# Patient Record
Sex: Female | Born: 1980 | Race: White | Hispanic: No | Smoking: Never smoker
Health system: Southern US, Community
[De-identification: ages and names within clinical notes are randomized; demographics above are authoritative.]

## PROBLEM LIST (undated history)

## (undated) DIAGNOSIS — O99345 Other mental disorders complicating the puerperium: Secondary | ICD-10-CM

## (undated) DIAGNOSIS — G4730 Sleep apnea, unspecified: Secondary | ICD-10-CM

## (undated) DIAGNOSIS — S5290XA Unspecified fracture of unspecified forearm, initial encounter for closed fracture: Secondary | ICD-10-CM

## (undated) DIAGNOSIS — S52209A Unspecified fracture of shaft of unspecified ulna, initial encounter for closed fracture: Secondary | ICD-10-CM

## (undated) DIAGNOSIS — S42409B Unspecified fracture of lower end of unspecified humerus, initial encounter for open fracture: Secondary | ICD-10-CM

## (undated) DIAGNOSIS — F53 Postpartum depression: Secondary | ICD-10-CM

## (undated) DIAGNOSIS — D649 Anemia, unspecified: Secondary | ICD-10-CM

## (undated) HISTORY — PX: INTRAUTERINE DEVICE INSERTION: SHX323

---

## 2005-08-17 ENCOUNTER — Emergency Department: Payer: Self-pay | Admitting: Emergency Medicine

## 2005-10-14 ENCOUNTER — Emergency Department: Payer: Self-pay | Admitting: Unknown Physician Specialty

## 2005-10-16 ENCOUNTER — Emergency Department: Payer: Self-pay | Admitting: Emergency Medicine

## 2006-03-23 ENCOUNTER — Emergency Department: Payer: Self-pay | Admitting: Emergency Medicine

## 2006-11-02 ENCOUNTER — Emergency Department: Payer: Self-pay | Admitting: Unknown Physician Specialty

## 2006-11-05 ENCOUNTER — Emergency Department: Payer: Self-pay

## 2009-07-26 ENCOUNTER — Emergency Department: Payer: Self-pay | Admitting: Emergency Medicine

## 2010-08-25 ENCOUNTER — Emergency Department: Payer: Self-pay | Admitting: Emergency Medicine

## 2010-10-09 ENCOUNTER — Emergency Department: Payer: Self-pay | Admitting: Unknown Physician Specialty

## 2010-10-30 ENCOUNTER — Ambulatory Visit: Payer: Medicaid Other | Admitting: Family Medicine

## 2010-10-30 ENCOUNTER — Encounter: Payer: Self-pay | Admitting: Family Medicine

## 2010-10-30 DIAGNOSIS — J069 Acute upper respiratory infection, unspecified: Secondary | ICD-10-CM

## 2010-10-30 DIAGNOSIS — J309 Allergic rhinitis, unspecified: Secondary | ICD-10-CM | POA: Insufficient documentation

## 2010-10-30 DIAGNOSIS — Z331 Pregnant state, incidental: Secondary | ICD-10-CM

## 2010-11-01 ENCOUNTER — Emergency Department: Payer: Self-pay | Admitting: Emergency Medicine

## 2010-11-04 ENCOUNTER — Encounter: Payer: Self-pay | Admitting: Family Medicine

## 2010-11-06 NOTE — Assessment & Plan Note (Signed)
Summary: SORE THROAT, ACHY BODY/EVM   Vital Signs:  Patient Profile:   30 Years Old Female CC:      sore throat, sick Weight:      144 pounds O2 Sat:      100 % O2 treatment:    Room Air Temp:     100.2 degrees F oral Pulse rate:   79 / minute Pulse rhythm:   regular Resp:     16 per minute  Pt. in pain?   no  Vitals Entered By: Standley Dakins MD (October 30, 2010 2:27 PM)                   Current Allergies (reviewed today): No known allergies History of Present Illness History from: patient Reason for visit: see chief complaint Chief Complaint: sore throat, sick History of Present Illness: The patient is currently [redacted] weeks pregnant and reporting that she is having sore throat x 2 days, nausea and started vomiting this morning.  She has had emesis 5 times today.  She has been taking OTC cough/cold meds.  She has taken Tylenol Multisymptom formulation.  She is having no diarrhea.  She reports that people at her job have been sick with URI symptoms.  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  She says she has some weakness and body aches.  She is having allergy symptoms of sneezing, RN, postnasal drainage.  Coughing makes her vomit.  She can keep fluids down but not solids.    REVIEW OF SYSTEMS Constitutional Symptoms       Complains of weight gain and fatigue.     Denies fever, chills, night sweats, and weight loss.  Eyes       Denies change in vision, eye pain, eye discharge, glasses, contact lenses, and eye surgery. Ear/Nose/Throat/Mouth       Complains of frequent nose bleeds, sore throat, and hoarseness.      Denies hearing loss/aids, change in hearing, ear pain, ear discharge, dizziness, frequent runny nose, sinus problems, and tooth pain or bleeding.  Respiratory       Complains of dry cough and shortness of breath.      Denies productive cough, wheezing, asthma, bronchitis, and emphysema/COPD.  Cardiovascular       Denies murmurs, chest pain, and tires easily with  exhertion.    Gastrointestinal       Complains of nausea/vomiting.      Denies stomach pain, diarrhea, constipation, blood in bowel movements, and indigestion. Genitourniary       Denies painful urination, kidney stones, and loss of urinary control. Neurological       Complains of headaches and weakness.      Denies loss of or changes in sensation, numbness, tngling, tremors, paralysis, seizures, and fainting/blackouts. Musculoskeletal       Denies muscle pain, joint pain, joint stiffness, decreased range of motion, redness, swelling, muscle weakness, and gout.  Skin       Denies bruising, unusual mles/lumps or sores, and hair/skin or nail changes.  Psych       Denies mood changes, temper/anger issues, anxiety/stress, speech problems, depression, and sleep problems.  Past History:  Family History: Last updated: 10/30/2010 Healthy per patient  Social History: Last updated: 10/30/2010 Never Smoked Alcohol use-no Drug use-no Occupation:  [employer]  Risk Factors: Smoking Status: never (10/30/2010)  Past Medical History: Allergic Rhinitis Intrauterine pregnancy  Past Surgical History: Denies surgical history  Family History: Healthy per patient  Social  History: Never Smoked Alcohol use-no Drug use-no Occupation:  Archivist Smoking Status:  never Drug Use:  no Physical Exam General appearance: well developed, well nourished, no acute distress Head: normocephalic, atraumatic Eyes: conjunctivae and lids normal Pupils: equal, round, reactive to light Ears: normal, no lesions or deformities Nasal: pale, boggy, swollen nasal turbinates Oral/Pharynx: moist mucous membranes, tongue normal, posterior pharynx without erythema or exudate Neck: neck supple,  trachea midline, no masses Chest/Lungs: no rales, wheezes, or rhonchi bilateral, breath sounds equal without effort Heart: regular rate and  rhythm, no murmur Extremities: normal  extremities Neurological: grossly intact and non-focal Skin: no obvious rashes or lesions MSE: oriented to time, place, and person Assessment New Problems: INTRAUTERINE PREGNANCY (ICD-V22.2) UPPER RESPIRATORY INFECTION, ACUTE (ICD-465.9) ALLERGIC RHINITIS CAUSE UNSPECIFIED (ICD-477.9)   Patient Education: Patient and/or caregiver instructed in the following: rest, fluids, Tylenol prn. The risks, benefits and possible side effects were clearly explained and discussed with the patient.  The patient verbalized clear understanding.  The patient was given instructions to return if symptoms don't improve, worsen or new changes develop.  If it is not during clinic hours and the patient cannot get back to this clinic then the patient was told to seek medical care at an available urgent care or emergency department.  The patient verbalized understanding.   Demonstrates willingness to comply.  Plan New Medications/Changes: ROBITUSSIN DM 100-10 MG/5ML SYRP (DEXTROMETHORPHAN-GUAIFENESIN) take 1 teaspoon by mouth every 6 hours as needed cough and congestion  #5 oz x 0, 10/30/2010, Tyrice Hewitt MD RHINOCORT AQUA 32 MCG/ACT SUSP (BUDESONIDE) 1-2 sprays per nostril once daily  #1 x 0 [BMN], 10/30/2010, Cortlynn Hollinsworth MD EXTRA STRENGTH ACETAMINOPHEN 500 MG CAPS (ACETAMINOPHEN) take 1 by mouth every 4 hours as needed severe sore throat  #20 x 0, 10/30/2010, Sinjin Amero MD CLARITIN 10 MG TABS (LORATADINE) take 1 by mouth daily for allergies  #20 x 0, 10/30/2010, Mycal Conde MD  Follow Up: Follow up in 2-3 days if no improvement, Follow up on an as needed basis, Follow up with Primary Physician Work/School Excuse: Return to work/school in 2 days  The patient and/or caregiver has been counseled thoroughly with regard to medications prescribed including dosage, schedule, interactions, rationale for use, and possible side effects and they verbalize understanding.  Diagnoses and expected course of  recovery discussed and will return if not improved as expected or if the condition worsens. Patient and/or caregiver verbalized understanding.  Prescriptions: ROBITUSSIN DM 100-10 MG/5ML SYRP (DEXTROMETHORPHAN-GUAIFENESIN) take 1 teaspoon by mouth every 6 hours as needed cough and congestion  #5 oz x 0   Entered and Authorized by:   Standley Dakins MD   Signed by:   Standley Dakins MD on 10/30/2010   Method used:   Electronically to        [facility] [phone]* (retail)       [address]       [place], [place]  [address]       Ph: [phone]       Fax: [phone]   RxID:   [id number] RHINOCORT AQUA 32 MCG/ACT SUSP (BUDESONIDE) 1-2 sprays per nostril once daily Brand medically necessary #1 x 0   Entered and Authorized by:   Standley Dakins MD   Signed by:   Standley Dakins MD on 10/30/2010   Method used:   [facility]ronically to        [facility]  [address] [phone]* (retail)       [address]       [place], [place]  [address]       Ph: [phone]       Fax: [phone]   RxID:   [id number] EXTRA STRENGTH ACETAMINOPHEN 500 MG CAPS (ACETAMINOPHEN) take 1 by mouth every 4 hours as needed severe sore throat  #20 x 0   Entered and Authorized by:   Standley Dakins MD   Signed by:   Standley Dakins MD on 10/30/2010   Method used:   Electronically to        [facility] [phone]* (retail)       [address]       [place], [place]  [address]       Ph: [phone]       Fax: [phone]   RxID:   [id number] CLARITIN 10 MG TABS (LORATADINE) take 1 by mouth daily for allergies  #20 x 0   Entered and Authorized by:   Standley Dakins MD   Signed by:   Standley Dakins MD on 10/30/2010   Method used:   Electronically to        [facility] [phone]* (retail)       [address]       [place], [place]  [address]       Ph: [phone]       Fax: [phone]   RxID:   [id number]   Patient Instructions: 1)  Go to the  pharmacy and pick up your prescription (s).  It may take up to 30 mins for electronic prescriptions to be delivered to the pharmacy.  Please call if your pharmacy has not received your prescriptions after 30 minutes.   2)  Take your antibiotic as prescribed until ALL of it is gone, but stop if you develop a rash or swelling and contact our office as soon as possible. 3)  Get plenty of rest, drink lots of clear liquids, and use Tylenol or Ibuprofen for fever and comfort. Return in 7-10 days if you're not better:sooner if you're feeling worse. 4)  Return or go to the ER if no improvement or symptoms getting worse.   5)  See your OB in 1 week for recheck and follow up.  6)  The patient was informed that there is no on-call provider or services available at this clinic during off-hours (when the clinic is closed).  If the patient developed a problem or concern that required immediate attention, the patient was advised to go the the nearest available urgent care or emergency department for medical care.  The patient verbalized understanding.

## 2010-11-06 NOTE — Letter (Signed)
Summary: Out of Work  [facility]  [address]   [place], [place] [address]   Phone: [phone]  Fax: [phone]    October 30, 2010   Employee:  [employer]    To Whom It May Concern:   For Medical reasons, please excuse the above named employee from work for the following dates:  Start:   October 30, 2010    End:   November 01, 2010  If you need additional information, please feel free to contact our office.         Sincerely,    Standley Dakins MD

## 2010-11-12 NOTE — Letter (Signed)
Summary: Faxed authorization form  Faxed authorization form   Imported By: Dorna Leitz 11/06/2010 19:54:29  _____________________________________________________________________  External Attachment:    Type:   Image     Comment:   External Document

## 2011-09-16 HISTORY — PX: BACK SURGERY: SHX140

## 2011-10-15 ENCOUNTER — Emergency Department: Payer: Self-pay | Admitting: Emergency Medicine

## 2011-10-15 LAB — URINALYSIS, COMPLETE
Bilirubin,UR: NEGATIVE
Glucose,UR: NEGATIVE mg/dL (ref 0–75)
Ketone: NEGATIVE
Nitrite: NEGATIVE
Specific Gravity: 1.012 (ref 1.003–1.030)
Squamous Epithelial: 1
WBC UR: 1 /HPF (ref 0–5)

## 2011-10-15 LAB — CBC
HCT: 36 % (ref 35.0–47.0)
HGB: 11.9 g/dL — ABNORMAL LOW (ref 12.0–16.0)
MCV: 88 fL (ref 80–100)
RBC: 4.08 10*6/uL (ref 3.80–5.20)
RDW: 13.5 % (ref 11.5–14.5)
WBC: 4.9 10*3/uL (ref 3.6–11.0)

## 2011-10-15 LAB — COMPREHENSIVE METABOLIC PANEL
Albumin: 4 g/dL (ref 3.4–5.0)
Anion Gap: 11 (ref 7–16)
Calcium, Total: 9.4 mg/dL (ref 8.5–10.1)
Chloride: 103 mmol/L (ref 98–107)
Co2: 27 mmol/L (ref 21–32)
Creatinine: 0.79 mg/dL (ref 0.60–1.30)
EGFR (African American): >60
EGFR (Non-African Amer.): >60
Osmolality: 282 (ref 275–301)
Potassium: 3.5 mmol/L (ref 3.5–5.1)
SGOT(AST): 15 U/L (ref 15–37)

## 2011-10-15 LAB — TROPONIN I: Troponin-I: <0.02 ng/mL

## 2012-01-09 ENCOUNTER — Emergency Department: Payer: Self-pay | Admitting: *Deleted

## 2012-01-09 LAB — DIFFERENTIAL
Basophil %: 0.3 %
Eosinophil #: 0 10*3/uL (ref 0.0–0.7)
Eosinophil %: 0.2 %
Lymphocyte #: 0.4 10*3/uL — ABNORMAL LOW (ref 1.0–3.6)
Monocyte %: 6.5 %

## 2012-01-09 LAB — URINALYSIS, COMPLETE
Blood: NEGATIVE
Nitrite: NEGATIVE
Protein: NEGATIVE
Specific Gravity: 1.013 (ref 1.003–1.030)
WBC UR: 4 /HPF (ref 0–5)

## 2012-01-09 LAB — COMPREHENSIVE METABOLIC PANEL
Albumin: 3.6 g/dL (ref 3.4–5.0)
Alkaline Phosphatase: 65 U/L (ref 50–136)
Anion Gap: 8 (ref 7–16)
BUN: 12 mg/dL (ref 7–18)
Calcium, Total: 7.9 mg/dL — ABNORMAL LOW (ref 8.5–10.1)
Chloride: 103 mmol/L (ref 98–107)
Co2: 26 mmol/L (ref 21–32)
EGFR (African American): >60
EGFR (Non-African Amer.): >60
Osmolality: 273 (ref 275–301)
Potassium: 3.3 mmol/L — ABNORMAL LOW (ref 3.5–5.1)
SGPT (ALT): 16 U/L
Sodium: 137 mmol/L (ref 136–145)

## 2012-01-09 LAB — CBC
HCT: 38.6 % (ref 35.0–47.0)
HGB: 13.2 g/dL (ref 12.0–16.0)
MCH: 29.6 pg (ref 26.0–34.0)
MCHC: 34.3 g/dL (ref 32.0–36.0)
MCV: 87 fL (ref 80–100)
RBC: 4.46 10*6/uL (ref 3.80–5.20)

## 2012-02-16 ENCOUNTER — Ambulatory Visit: Payer: Self-pay | Admitting: Obstetrics and Gynecology

## 2012-04-17 ENCOUNTER — Emergency Department (HOSPITAL_COMMUNITY): Payer: 59

## 2012-04-17 ENCOUNTER — Inpatient Hospital Stay (HOSPITAL_COMMUNITY): Payer: 59 | Admitting: Anesthesiology

## 2012-04-17 ENCOUNTER — Encounter (HOSPITAL_COMMUNITY): Payer: Self-pay | Admitting: *Deleted

## 2012-04-17 ENCOUNTER — Inpatient Hospital Stay (HOSPITAL_COMMUNITY): Payer: 59

## 2012-04-17 ENCOUNTER — Encounter (HOSPITAL_COMMUNITY): Payer: Self-pay | Admitting: Anesthesiology

## 2012-04-17 ENCOUNTER — Encounter (HOSPITAL_COMMUNITY): Admission: EM | Disposition: A | Discharge status: Rehabilitation Facility | Payer: Self-pay | Source: Home / Self Care

## 2012-04-17 ENCOUNTER — Inpatient Hospital Stay (HOSPITAL_COMMUNITY)
Admission: EM | Admit: 2012-04-17 | Discharge: 2012-04-27 | DRG: 958 | Disposition: A | Discharge status: Rehabilitation Facility | Payer: 59 | Attending: General Surgery | Admitting: General Surgery

## 2012-04-17 DIAGNOSIS — S81009A Unspecified open wound, unspecified knee, initial encounter: Secondary | ICD-10-CM | POA: Diagnosis present

## 2012-04-17 DIAGNOSIS — K560 Paralytic ileus: Secondary | ICD-10-CM | POA: Diagnosis present

## 2012-04-17 DIAGNOSIS — S2243XA Multiple fractures of ribs, bilateral, initial encounter for closed fracture: Secondary | ICD-10-CM

## 2012-04-17 DIAGNOSIS — S0285XA Fracture of orbit, unspecified, initial encounter for closed fracture: Secondary | ICD-10-CM

## 2012-04-17 DIAGNOSIS — S42413B Displaced simple supracondylar fracture without intercondylar fracture of unspecified humerus, initial encounter for open fracture: Secondary | ICD-10-CM | POA: Diagnosis present

## 2012-04-17 DIAGNOSIS — S52509A Unspecified fracture of the lower end of unspecified radius, initial encounter for closed fracture: Secondary | ICD-10-CM | POA: Diagnosis present

## 2012-04-17 DIAGNOSIS — T07XXXA Unspecified multiple injuries, initial encounter: Secondary | ICD-10-CM

## 2012-04-17 DIAGNOSIS — S0240DA Maxillary fracture, left side, initial encounter for closed fracture: Secondary | ICD-10-CM | POA: Diagnosis present

## 2012-04-17 DIAGNOSIS — D62 Acute posthemorrhagic anemia: Secondary | ICD-10-CM | POA: Diagnosis present

## 2012-04-17 DIAGNOSIS — S02400A Malar fracture unspecified, initial encounter for closed fracture: Secondary | ICD-10-CM | POA: Diagnosis present

## 2012-04-17 DIAGNOSIS — S71009A Unspecified open wound, unspecified hip, initial encounter: Secondary | ICD-10-CM | POA: Diagnosis present

## 2012-04-17 DIAGNOSIS — S82401B Unspecified fracture of shaft of right fibula, initial encounter for open fracture type I or II: Secondary | ICD-10-CM

## 2012-04-17 DIAGNOSIS — S52209A Unspecified fracture of shaft of unspecified ulna, initial encounter for closed fracture: Secondary | ICD-10-CM

## 2012-04-17 DIAGNOSIS — S42409B Unspecified fracture of lower end of unspecified humerus, initial encounter for open fracture: Secondary | ICD-10-CM | POA: Diagnosis present

## 2012-04-17 DIAGNOSIS — R000 Tachycardia, unspecified: Secondary | ICD-10-CM | POA: Diagnosis present

## 2012-04-17 DIAGNOSIS — IMO0002 Reserved for concepts with insufficient information to code with codable children: Secondary | ICD-10-CM | POA: Diagnosis present

## 2012-04-17 DIAGNOSIS — S060X9A Concussion with loss of consciousness of unspecified duration, initial encounter: Secondary | ICD-10-CM | POA: Diagnosis present

## 2012-04-17 DIAGNOSIS — S32009A Unspecified fracture of unspecified lumbar vertebra, initial encounter for closed fracture: Secondary | ICD-10-CM | POA: Diagnosis present

## 2012-04-17 DIAGNOSIS — R0902 Hypoxemia: Secondary | ICD-10-CM | POA: Diagnosis present

## 2012-04-17 DIAGNOSIS — S0230XA Fracture of orbital floor, unspecified side, initial encounter for closed fracture: Secondary | ICD-10-CM | POA: Diagnosis present

## 2012-04-17 DIAGNOSIS — N390 Urinary tract infection, site not specified: Secondary | ICD-10-CM | POA: Diagnosis not present

## 2012-04-17 DIAGNOSIS — S82409B Unspecified fracture of shaft of unspecified fibula, initial encounter for open fracture type I or II: Secondary | ICD-10-CM

## 2012-04-17 DIAGNOSIS — S71109A Unspecified open wound, unspecified thigh, initial encounter: Secondary | ICD-10-CM | POA: Diagnosis present

## 2012-04-17 DIAGNOSIS — S022XXA Fracture of nasal bones, initial encounter for closed fracture: Principal | ICD-10-CM | POA: Diagnosis present

## 2012-04-17 DIAGNOSIS — S82201B Unspecified fracture of shaft of right tibia, initial encounter for open fracture type I or II: Secondary | ICD-10-CM | POA: Diagnosis present

## 2012-04-17 DIAGNOSIS — S82209B Unspecified fracture of shaft of unspecified tibia, initial encounter for open fracture type I or II: Secondary | ICD-10-CM

## 2012-04-17 DIAGNOSIS — S02401A Maxillary fracture, unspecified, initial encounter for closed fracture: Secondary | ICD-10-CM | POA: Diagnosis present

## 2012-04-17 DIAGNOSIS — S32039A Unspecified fracture of third lumbar vertebra, initial encounter for closed fracture: Secondary | ICD-10-CM | POA: Diagnosis present

## 2012-04-17 DIAGNOSIS — S2249XA Multiple fractures of ribs, unspecified side, initial encounter for closed fracture: Secondary | ICD-10-CM

## 2012-04-17 DIAGNOSIS — S0990XA Unspecified injury of head, initial encounter: Secondary | ICD-10-CM

## 2012-04-17 DIAGNOSIS — S060X0A Concussion without loss of consciousness, initial encounter: Secondary | ICD-10-CM | POA: Diagnosis present

## 2012-04-17 DIAGNOSIS — S71112A Laceration without foreign body, left thigh, initial encounter: Secondary | ICD-10-CM | POA: Diagnosis present

## 2012-04-17 HISTORY — DX: Unspecified fracture of lower end of unspecified humerus, initial encounter for open fracture: S42.409B

## 2012-04-17 HISTORY — DX: Unspecified fracture of shaft of unspecified ulna, initial encounter for closed fracture: S52.209A

## 2012-04-17 HISTORY — DX: Postpartum depression: F53.0

## 2012-04-17 HISTORY — PX: ORIF HUMERUS FRACTURE: SHX2126

## 2012-04-17 HISTORY — DX: Other mental disorders complicating the puerperium: O99.345

## 2012-04-17 HISTORY — PX: TIBIA IM NAIL INSERTION: SHX2516

## 2012-04-17 HISTORY — DX: Unspecified fracture of unspecified forearm, initial encounter for closed fracture: S52.90XA

## 2012-04-17 LAB — COMPREHENSIVE METABOLIC PANEL
AST: 128 U/L — ABNORMAL HIGH (ref 0–37)
Albumin: 3.6 g/dL (ref 3.5–5.2)
BUN: 8 mg/dL (ref 6–23)
Calcium: 8.1 mg/dL — ABNORMAL LOW (ref 8.4–10.5)
Creatinine, Ser: 0.85 mg/dL (ref 0.50–1.10)
Total Protein: 7.3 g/dL (ref 6.0–8.3)

## 2012-04-17 LAB — POCT I-STAT, CHEM 8
BUN: 6 mg/dL (ref 6–23)
Calcium, Ion: 1.09 mmol/L — ABNORMAL LOW (ref 1.12–1.23)
Creatinine, Ser: 1.2 mg/dL — ABNORMAL HIGH (ref 0.50–1.10)
TCO2: 18 mmol/L (ref 0–100)

## 2012-04-17 LAB — RAPID URINE DRUG SCREEN, HOSP PERFORMED
Amphetamines: NOT DETECTED
Benzodiazepines: POSITIVE — AB
Opiates: NOT DETECTED
Tetrahydrocannabinol: NOT DETECTED

## 2012-04-17 LAB — BASIC METABOLIC PANEL
Calcium: 7.6 mg/dL — ABNORMAL LOW (ref 8.4–10.5)
Creatinine, Ser: 0.59 mg/dL (ref 0.50–1.10)
GFR calc Af Amer: >90 mL/min (ref >90)
GFR calc non Af Amer: >90 mL/min (ref >90)

## 2012-04-17 LAB — URINALYSIS, MICROSCOPIC ONLY
Bilirubin Urine: NEGATIVE
Glucose, UA: NEGATIVE mg/dL
Nitrite: NEGATIVE
Specific Gravity, Urine: 1.034 — ABNORMAL HIGH (ref 1.005–1.030)
pH: 5.5 (ref 5.0–8.0)

## 2012-04-17 LAB — ETHANOL: Alcohol, Ethyl (B): 154 mg/dL — ABNORMAL HIGH (ref 0–11)

## 2012-04-17 LAB — CBC
HCT: 33.9 % — ABNORMAL LOW (ref 36.0–46.0)
Hemoglobin: 11.7 g/dL — ABNORMAL LOW (ref 12.0–15.0)
MCH: 29.1 pg (ref 26.0–34.0)
MCH: 29.1 pg (ref 26.0–34.0)
MCV: 84.3 fL (ref 78.0–100.0)
MCV: 83.6 fL (ref 78.0–100.0)
Platelets: 250 10*3/uL (ref 150–400)
Platelets: 148 10*3/uL — ABNORMAL LOW (ref 150–400)
RBC: 4.02 MIL/uL (ref 3.87–5.11)
RDW: 12 % (ref 11.5–15.5)
WBC: 18.1 10*3/uL — ABNORMAL HIGH (ref 4.0–10.5)

## 2012-04-17 LAB — PROTIME-INR: Prothrombin Time: 16 seconds — ABNORMAL HIGH (ref 11.6–15.2)

## 2012-04-17 LAB — MRSA PCR SCREENING: MRSA by PCR: NEGATIVE

## 2012-04-17 LAB — PREGNANCY, URINE: Preg Test, Ur: NEGATIVE

## 2012-04-17 SURGERY — LAPAROTOMY, EXPLORATORY
Anesthesia: General

## 2012-04-17 SURGERY — INSERTION, INTRAMEDULLARY ROD, TIBIA
Anesthesia: General | Site: Leg Lower | Laterality: Right | Wound class: Clean

## 2012-04-17 MED ORDER — ROCURONIUM BROMIDE 100 MG/10ML IV SOLN
INTRAVENOUS | Status: DC | PRN
  Administered 2012-04-17: 20 mg via INTRAVENOUS
  Administered 2012-04-17: 50 mg via INTRAVENOUS

## 2012-04-17 MED ORDER — MIDAZOLAM HCL 2 MG/2ML IJ SOLN
2.0000 mg | Freq: Once | INTRAMUSCULAR | Status: AC
  Administered 2012-04-17: 2 mg via INTRAVENOUS
  Filled 2012-04-17: qty 2

## 2012-04-17 MED ORDER — METHOCARBAMOL 100 MG/ML IJ SOLN
500.0000 mg | Freq: Four times a day (QID) | INTRAVENOUS | Status: DC | PRN
  Administered 2012-04-18: 500 mg via INTRAVENOUS
  Filled 2012-04-17 (×2): qty 5

## 2012-04-17 MED ORDER — CEFAZOLIN SODIUM 1-5 GM-% IV SOLN
1.0000 g | Freq: Four times a day (QID) | INTRAVENOUS | Status: AC
  Administered 2012-04-17 – 2012-04-18 (×3): 1 g via INTRAVENOUS
  Filled 2012-04-17 (×3): qty 50

## 2012-04-17 MED ORDER — HYDROMORPHONE HCL PF 1 MG/ML IJ SOLN
INTRAMUSCULAR | Status: AC
  Filled 2012-04-17: qty 1

## 2012-04-17 MED ORDER — SODIUM CHLORIDE 0.45 % IV SOLN
INTRAVENOUS | Status: DC
  Administered 2012-04-17: 19:00:00 via INTRAVENOUS
  Administered 2012-04-18: 100 mL/h via INTRAVENOUS
  Administered 2012-04-18 – 2012-04-19 (×4): via INTRAVENOUS

## 2012-04-17 MED ORDER — LABETALOL HCL 5 MG/ML IV SOLN
INTRAVENOUS | Status: DC | PRN
  Administered 2012-04-17: 5 mg via INTRAVENOUS

## 2012-04-17 MED ORDER — HYDROMORPHONE HCL PF 1 MG/ML IJ SOLN
0.5000 mg | INTRAMUSCULAR | Status: DC | PRN
  Administered 2012-04-17 (×2): 0.5 mg via INTRAVENOUS
  Administered 2012-04-17 – 2012-04-18 (×6): 1 mg via INTRAVENOUS
  Administered 2012-04-18 (×2): 0.5 mg via INTRAVENOUS
  Administered 2012-04-19: 1 mg via INTRAVENOUS
  Administered 2012-04-19: 0.5 mg via INTRAVENOUS
  Administered 2012-04-19 (×2): 1 mg via INTRAVENOUS
  Administered 2012-04-19 (×2): 0.5 mg via INTRAVENOUS
  Administered 2012-04-19: 1 mg via INTRAVENOUS
  Filled 2012-04-17 (×17): qty 1

## 2012-04-17 MED ORDER — PHENYLEPHRINE HCL 10 MG/ML IJ SOLN
INTRAMUSCULAR | Status: DC | PRN
  Administered 2012-04-17: 80 ug via INTRAVENOUS
  Administered 2012-04-17: 40 ug via INTRAVENOUS

## 2012-04-17 MED ORDER — POTASSIUM CHLORIDE 10 MEQ/100ML IV SOLN: 10.0000 meq | INTRAVENOUS | Status: DC

## 2012-04-17 MED ORDER — MORPHINE SULFATE 2 MG/ML IJ SOLN
2.0000 mg | INTRAMUSCULAR | Status: DC | PRN
  Administered 2012-04-17 (×2): 2 mg via INTRAVENOUS
  Filled 2012-04-17 (×2): qty 1

## 2012-04-17 MED ORDER — OXYCODONE HCL 5 MG PO TABS
5.0000 mg | ORAL_TABLET | ORAL | Status: DC | PRN
  Administered 2012-04-18 (×2): 10 mg via ORAL
  Administered 2012-04-18: 5 mg via ORAL
  Administered 2012-04-19 – 2012-04-20 (×3): 10 mg via ORAL
  Filled 2012-04-17 (×3): qty 2
  Filled 2012-04-17: qty 1
  Filled 2012-04-17 (×2): qty 2

## 2012-04-17 MED ORDER — LACTATED RINGERS IV SOLN
INTRAVENOUS | Status: DC | PRN
  Administered 2012-04-17 (×3): via INTRAVENOUS

## 2012-04-17 MED ORDER — LIDOCAINE HCL (CARDIAC) 20 MG/ML IV SOLN
INTRAVENOUS | Status: AC
  Filled 2012-04-17: qty 5

## 2012-04-17 MED ORDER — FENTANYL CITRATE 0.05 MG/ML IJ SOLN
50.0000 ug | Freq: Once | INTRAMUSCULAR | Status: AC
  Administered 2012-04-17: 50 ug via INTRAVENOUS
  Filled 2012-04-17: qty 2

## 2012-04-17 MED ORDER — OXYCODONE-ACETAMINOPHEN 5-325 MG PO TABS
1.0000 | ORAL_TABLET | ORAL | Status: DC | PRN
  Administered 2012-04-18 – 2012-04-20 (×3): 2 via ORAL
  Administered 2012-04-20: 1 via ORAL
  Filled 2012-04-17: qty 2
  Filled 2012-04-17 (×2): qty 1
  Filled 2012-04-17: qty 2
  Filled 2012-04-17: qty 1

## 2012-04-17 MED ORDER — KCL IN DEXTROSE-NACL 20-5-0.45 MEQ/L-%-% IV SOLN
INTRAVENOUS | Status: DC | PRN
  Administered 2012-04-17: 11:00:00 via INTRAVENOUS

## 2012-04-17 MED ORDER — POTASSIUM CHLORIDE 10 MEQ/100ML IV SOLN
10.0000 meq | INTRAVENOUS | Status: DC
  Filled 2012-04-17 (×3): qty 100

## 2012-04-17 MED ORDER — SENNA 8.6 MG PO TABS
1.0000 | ORAL_TABLET | Freq: Two times a day (BID) | ORAL | Status: DC
  Administered 2012-04-17 – 2012-04-27 (×19): 8.6 mg via ORAL
  Filled 2012-04-17 (×22): qty 1

## 2012-04-17 MED ORDER — ETOMIDATE 2 MG/ML IV SOLN
INTRAVENOUS | Status: AC
  Filled 2012-04-17: qty 20

## 2012-04-17 MED ORDER — POTASSIUM CHLORIDE 10 MEQ/100ML IV SOLN
10.0000 meq | Freq: Once | INTRAVENOUS | Status: AC
  Administered 2012-04-17: 10 meq via INTRAVENOUS
  Filled 2012-04-17: qty 100

## 2012-04-17 MED ORDER — PROPOFOL 10 MG/ML IV EMUL
INTRAVENOUS | Status: DC | PRN
  Administered 2012-04-17: 150 mg via INTRAVENOUS

## 2012-04-17 MED ORDER — ONDANSETRON HCL 4 MG/2ML IJ SOLN: 4.0000 mg | Freq: Four times a day (QID) | INTRAMUSCULAR | Status: DC | PRN

## 2012-04-17 MED ORDER — ROCURONIUM BROMIDE 50 MG/5ML IV SOLN
INTRAVENOUS | Status: AC
  Filled 2012-04-17: qty 2

## 2012-04-17 MED ORDER — KCL IN DEXTROSE-NACL 20-5-0.45 MEQ/L-%-% IV SOLN
INTRAVENOUS | Status: DC
  Administered 2012-04-17: 10:00:00 via INTRAVENOUS
  Filled 2012-04-17 (×5): qty 1000

## 2012-04-17 MED ORDER — CEFAZOLIN SODIUM-DEXTROSE 2-3 GM-% IV SOLR
2.0000 g | Freq: Once | INTRAVENOUS | Status: AC
  Administered 2012-04-17 (×2): 2 g via INTRAVENOUS
  Filled 2012-04-17: qty 50

## 2012-04-17 MED ORDER — BUPIVACAINE HCL (PF) 0.25 % IJ SOLN
INTRAMUSCULAR | Status: DC | PRN
  Administered 2012-04-17: 15 mL

## 2012-04-17 MED ORDER — METOCLOPRAMIDE HCL 10 MG PO TABS
5.0000 mg | ORAL_TABLET | Freq: Three times a day (TID) | ORAL | Status: DC | PRN
Start: 2012-04-17 — End: 2012-04-20

## 2012-04-17 MED ORDER — METHOCARBAMOL 500 MG PO TABS
500.0000 mg | ORAL_TABLET | Freq: Four times a day (QID) | ORAL | Status: DC | PRN
  Administered 2012-04-18 – 2012-04-21 (×7): 500 mg via ORAL
  Filled 2012-04-17 (×8): qty 1

## 2012-04-17 MED ORDER — BIOTENE DRY MOUTH MT LIQD: 15.0000 mL | Freq: Two times a day (BID) | OROMUCOSAL | Status: DC

## 2012-04-17 MED ORDER — PANTOPRAZOLE SODIUM 40 MG IV SOLR: 40.0000 mg | Freq: Every day | INTRAVENOUS | Status: DC

## 2012-04-17 MED ORDER — PANTOPRAZOLE SODIUM 40 MG PO TBEC: 40.0000 mg | DELAYED_RELEASE_TABLET | Freq: Every day | ORAL | Status: DC

## 2012-04-17 MED ORDER — POLYETHYLENE GLYCOL 3350 17 G PO PACK
17.0000 g | PACK | Freq: Every day | ORAL | Status: DC | PRN
  Filled 2012-04-17: qty 1

## 2012-04-17 MED ORDER — ONDANSETRON HCL 4 MG/2ML IJ SOLN
INTRAMUSCULAR | Status: DC | PRN
  Administered 2012-04-17: 4 mg via INTRAVENOUS

## 2012-04-17 MED ORDER — POTASSIUM CHLORIDE 10 MEQ/100ML IV SOLN
INTRAVENOUS | Status: DC | PRN
  Administered 2012-04-17 (×2): 10 meq via INTRAVENOUS

## 2012-04-17 MED ORDER — BACITRACIN ZINC 500 UNIT/GM EX OINT
TOPICAL_OINTMENT | CUTANEOUS | Status: DC | PRN
  Administered 2012-04-17: 1 via TOPICAL

## 2012-04-17 MED ORDER — ONDANSETRON HCL 4 MG PO TABS: 4.0000 mg | ORAL_TABLET | Freq: Four times a day (QID) | ORAL | Status: DC | PRN

## 2012-04-17 MED ORDER — ONDANSETRON HCL 4 MG PO TABS
4.0000 mg | ORAL_TABLET | Freq: Four times a day (QID) | ORAL | Status: DC | PRN
  Administered 2012-04-21: 4 mg via ORAL
  Filled 2012-04-17: qty 1

## 2012-04-17 MED ORDER — CEFAZOLIN SODIUM 1-5 GM-% IV SOLN
1.0000 g | Freq: Three times a day (TID) | INTRAVENOUS | Status: DC
  Filled 2012-04-17 (×3): qty 50

## 2012-04-17 MED ORDER — ZOLPIDEM TARTRATE 5 MG PO TABS: 5.0000 mg | ORAL_TABLET | Freq: Every evening | ORAL | Status: DC | PRN

## 2012-04-17 MED ORDER — CHLORHEXIDINE GLUCONATE 0.12 % MT SOLN: 15.0000 mL | Freq: Two times a day (BID) | OROMUCOSAL | Status: DC

## 2012-04-17 MED ORDER — SORBITOL 70 % SOLN
30.0000 mL | Freq: Every day | Status: DC | PRN
  Filled 2012-04-17 (×2): qty 30

## 2012-04-17 MED ORDER — LIDOCAINE HCL (CARDIAC) 20 MG/ML IV SOLN
INTRAVENOUS | Status: DC | PRN
  Administered 2012-04-17: 7 mg via INTRAVENOUS

## 2012-04-17 MED ORDER — METOCLOPRAMIDE HCL 5 MG/ML IJ SOLN
5.0000 mg | Freq: Three times a day (TID) | INTRAMUSCULAR | Status: DC | PRN
  Filled 2012-04-17: qty 2

## 2012-04-17 MED ORDER — TETANUS-DIPHTH-ACELL PERTUSSIS 5-2.5-18.5 LF-MCG/0.5 IM SUSP
0.5000 mL | Freq: Once | INTRAMUSCULAR | Status: AC
  Administered 2012-04-17: 0.5 mL via INTRAMUSCULAR
  Filled 2012-04-17: qty 0.5

## 2012-04-17 MED ORDER — DIAZEPAM 5 MG PO TABS
5.0000 mg | ORAL_TABLET | Freq: Three times a day (TID) | ORAL | Status: DC | PRN
  Administered 2012-04-21: 5 mg via ORAL
  Filled 2012-04-17 (×2): qty 1

## 2012-04-17 MED ORDER — DOCUSATE SODIUM 100 MG PO CAPS
100.0000 mg | ORAL_CAPSULE | Freq: Two times a day (BID) | ORAL | Status: DC
  Administered 2012-04-17 – 2012-04-19 (×5): 100 mg via ORAL
  Filled 2012-04-17 (×7): qty 1

## 2012-04-17 MED ORDER — SUCCINYLCHOLINE CHLORIDE 20 MG/ML IJ SOLN
INTRAMUSCULAR | Status: AC
  Filled 2012-04-17: qty 10

## 2012-04-17 MED ORDER — IOHEXOL 300 MG/ML  SOLN
100.0000 mL | Freq: Once | INTRAMUSCULAR | Status: AC | PRN
  Administered 2012-04-17: 100 mL via INTRAVENOUS

## 2012-04-17 MED ORDER — HYDROMORPHONE HCL PF 1 MG/ML IJ SOLN
0.2500 mg | INTRAMUSCULAR | Status: DC | PRN
  Administered 2012-04-17 (×3): 0.5 mg via INTRAVENOUS
  Filled 2012-04-17: qty 1

## 2012-04-17 MED ORDER — ONDANSETRON HCL 4 MG/2ML IJ SOLN: 4.0000 mg | Freq: Once | INTRAMUSCULAR | Status: DC | PRN

## 2012-04-17 MED ORDER — 0.9 % SODIUM CHLORIDE (POUR BTL) OPTIME
TOPICAL | Status: DC | PRN
  Administered 2012-04-17: 1000 mL

## 2012-04-17 MED ORDER — ALBUMIN HUMAN 5 % IV SOLN
INTRAVENOUS | Status: DC | PRN
  Administered 2012-04-17: 12:00:00 via INTRAVENOUS

## 2012-04-17 MED ORDER — FENTANYL CITRATE 0.05 MG/ML IJ SOLN
INTRAMUSCULAR | Status: DC | PRN
  Administered 2012-04-17: 50 ug via INTRAVENOUS
  Administered 2012-04-17: 25 ug via INTRAVENOUS
  Administered 2012-04-17: 100 ug via INTRAVENOUS
  Administered 2012-04-17 (×4): 25 ug via INTRAVENOUS
  Administered 2012-04-17: 100 ug via INTRAVENOUS
  Administered 2012-04-17: 50 ug via INTRAVENOUS
  Administered 2012-04-17: 150 ug via INTRAVENOUS
  Administered 2012-04-17: 25 ug via INTRAVENOUS

## 2012-04-17 MED ORDER — DIPHENHYDRAMINE HCL 12.5 MG/5ML PO ELIX
12.5000 mg | ORAL_SOLUTION | ORAL | Status: DC | PRN
  Filled 2012-04-17: qty 10

## 2012-04-17 MED ORDER — ENOXAPARIN SODIUM 40 MG/0.4ML ~~LOC~~ SOLN
40.0000 mg | SUBCUTANEOUS | Status: DC
  Administered 2012-04-18 – 2012-04-21 (×4): 40 mg via SUBCUTANEOUS
  Filled 2012-04-17 (×5): qty 0.4

## 2012-04-17 SURGICAL SUPPLY — 100 items
3.5mm MD screw x60 (Screw) ×3 IMPLANT
4.0mm x 70  cannulated screw ×3 IMPLANT
BANDAGE ELASTIC 4 VELCRO ST LF (GAUZE/BANDAGES/DRESSINGS) ×6 IMPLANT
BANDAGE ELASTIC 6 VELCRO ST LF (GAUZE/BANDAGES/DRESSINGS) ×3 IMPLANT
BANDAGE ESMARK 6X9 LF (GAUZE/BANDAGES/DRESSINGS) IMPLANT
BANDAGE GAUZE ELAST BULKY 4 IN (GAUZE/BANDAGES/DRESSINGS) ×3 IMPLANT
BIT DRILL 2.5X2.75 QC CALB (BIT) ×9 IMPLANT
BIT DRILL 2.9 CANN QC NONSTRL (BIT) ×3 IMPLANT
BIT DRILL 3.8X6 NS (BIT) ×3 IMPLANT
BIT DRILL 4.4 NS (BIT) ×3 IMPLANT
BNDG COHESIVE 4X5 TAN STRL (GAUZE/BANDAGES/DRESSINGS) ×3 IMPLANT
BNDG ELASTIC 6X10 VLCR STRL LF (GAUZE/BANDAGES/DRESSINGS) ×3 IMPLANT
BNDG ESMARK 4X9 LF (GAUZE/BANDAGES/DRESSINGS) ×3 IMPLANT
BNDG ESMARK 6X9 LF (GAUZE/BANDAGES/DRESSINGS)
CLOTH BEACON ORANGE TIMEOUT ST (SAFETY) ×3 IMPLANT
COVER SURGICAL LIGHT HANDLE (MISCELLANEOUS) ×6 IMPLANT
CUFF TOURNIQUET SINGLE 18IN (TOURNIQUET CUFF) ×3 IMPLANT
CUFF TOURNIQUET SINGLE 34IN LL (TOURNIQUET CUFF) ×3 IMPLANT
CUFF TOURNIQUET SINGLE 44IN (TOURNIQUET CUFF) IMPLANT
DRAPE C-ARM 42X72 X-RAY (DRAPES) ×3 IMPLANT
DRAPE C-ARMOR (DRAPES) ×3 IMPLANT
DRAPE INCISE IOBAN 66X45 STRL (DRAPES) ×3 IMPLANT
DRAPE OEC MINIVIEW 54X84 (DRAPES) ×3 IMPLANT
DRAPE ORTHO SPLIT 77X108 STRL (DRAPES) ×2
DRAPE PROXIMA HALF (DRAPES) ×6 IMPLANT
DRAPE SURG ORHT 6 SPLT 77X108 (DRAPES) ×4 IMPLANT
DRAPE U-SHAPE 47X51 STRL (DRAPES) ×3 IMPLANT
DRSG ADAPTIC 3X8 NADH LF (GAUZE/BANDAGES/DRESSINGS) ×3 IMPLANT
ELECT REM PT RETURN 9FT ADLT (ELECTROSURGICAL) ×3
ELECTRODE REM PT RTRN 9FT ADLT (ELECTROSURGICAL) ×2 IMPLANT
EVACUATOR 1/8 PVC DRAIN (DRAIN) IMPLANT
FLUID NSS /IRRIG 3000 ML XXX (IV SOLUTION) ×6 IMPLANT
GAUZE XEROFORM 5X9 LF (GAUZE/BANDAGES/DRESSINGS) ×6 IMPLANT
GLOVE BIOGEL PI IND STRL 7.5 (GLOVE) ×2 IMPLANT
GLOVE BIOGEL PI INDICATOR 7.5 (GLOVE) ×1
GLOVE SKINSENSE NS SZ8.0 LF (GLOVE)
GLOVE SKINSENSE STRL SZ8.0 LF (GLOVE) IMPLANT
GLOVE SURG ORTHO 8.0 STRL STRW (GLOVE) IMPLANT
GOWN STRL NON-REIN LRG LVL3 (GOWN DISPOSABLE) ×9 IMPLANT
GOWN STRL REIN 3XL LVL4 (GOWN DISPOSABLE) ×3 IMPLANT
GOWN STRL REIN XL XLG (GOWN DISPOSABLE) ×6 IMPLANT
GUIDEWIRE BALL NOSE 80CM (WIRE) ×3 IMPLANT
HANDPIECE INTERPULSE COAX TIP (DISPOSABLE) ×1
K-WIRE ACE 1.6X6 (WIRE) ×18
KIT BASIN OR (CUSTOM PROCEDURE TRAY) ×6 IMPLANT
KIT ROOM TURNOVER OR (KITS) ×3 IMPLANT
KWIRE ACE 1.6X6 (WIRE) ×12 IMPLANT
NAIL TIBIAL 9MMX33CM (Nail) ×3 IMPLANT
PACK ORTHO EXTREMITY (CUSTOM PROCEDURE TRAY) ×6 IMPLANT
PAD ARMBOARD 7.5X6 YLW CONV (MISCELLANEOUS) ×6 IMPLANT
PADDING CAST ABS 4INX4YD NS (CAST SUPPLIES) ×2
PADDING CAST ABS COTTON 4X4 ST (CAST SUPPLIES) ×4 IMPLANT
PLATE DIST HUM MEDIAL LT SM (Plate) ×3 IMPLANT
PLATE LOCK COMP 5H FOOT (Plate) ×3 IMPLANT
PLATE LOCK COMP 6H FOOT (Plate) ×3 IMPLANT
PLATE LOCK LT LG 85X9 (Plate) ×2 IMPLANT
PLATE LOCK LT LRG (Plate) ×1 IMPLANT
PUTTY DBM STAGRAFT 5CC (Putty) ×3 IMPLANT
SCREW ACECAP 30MM (Screw) ×3 IMPLANT
SCREW ACECAP 38MM (Screw) ×3 IMPLANT
SCREW ACECAP 40MM (Screw) ×3 IMPLANT
SCREW ACECAP 42MM (Screw) ×3 IMPLANT
SCREW CORT 3.5X26 (Screw) ×1 IMPLANT
SCREW CORT T15 24X3.5XST LCK (Screw) ×2 IMPLANT
SCREW CORT T15 26X3.5XST LCK (Screw) ×2 IMPLANT
SCREW CORT T15 28X3.5XST LCK (Screw) ×2 IMPLANT
SCREW CORT T15 30X3.5XST LCK (Screw) ×2 IMPLANT
SCREW CORTICAL 3.5MM 14MM (Screw) ×30 IMPLANT
SCREW CORTICAL 3.5X24MM (Screw) ×1 IMPLANT
SCREW CORTICAL 3.5X28MM (Screw) ×1 IMPLANT
SCREW CORTICAL 3.5X30MM (Screw) ×1 IMPLANT
SCREW LOCK 3.5X34 DIST TIB (Screw) ×3 IMPLANT
SCREW LOCK CORT STAR 3.5X16 (Screw) ×3 IMPLANT
SCREW NON LOCKING LP 3.5 16MM (Screw) ×3 IMPLANT
SCREW PROXIMAL DEPUY (Screw) ×1 IMPLANT
SCREW PRXML FT 45X5.5XLCK NS (Screw) ×2 IMPLANT
SET HNDPC FAN SPRY TIP SCT (DISPOSABLE) ×2 IMPLANT
SLEEVE SURGEON STRL (DRAPES) ×3 IMPLANT
SLING ARM FOAM STRAP LRG (SOFTGOODS) ×3 IMPLANT
SPLINT PLASTER CAST XFAST 5X30 (CAST SUPPLIES) ×4 IMPLANT
SPLINT PLASTER XFAST SET 5X30 (CAST SUPPLIES) ×2
SPONGE GAUZE 4X4 12PLY (GAUZE/BANDAGES/DRESSINGS) ×6 IMPLANT
STAPLER VISISTAT 35W (STAPLE) IMPLANT
STOCKINETTE TUBULAR 6 INCH (GAUZE/BANDAGES/DRESSINGS) ×3 IMPLANT
SUT FIBERWIRE #2 38 T-5 BLUE (SUTURE) ×3
SUT MNCRL AB 3-0 PS2 18 (SUTURE) ×6 IMPLANT
SUT PDS AB 0 CT 36 (SUTURE) ×3 IMPLANT
SUT PROLENE 3 0 PS 2 (SUTURE) ×6 IMPLANT
SUT VIC AB 0 CT1 27 (SUTURE)
SUT VIC AB 0 CT1 27XBRD ANBCTR (SUTURE) IMPLANT
SUT VIC AB 2-0 CT1 27 (SUTURE) ×2
SUT VIC AB 2-0 CT1 TAPERPNT 27 (SUTURE) ×4 IMPLANT
SUT VIC AB 2-0 FS1 27 (SUTURE) ×6 IMPLANT
SUTURE FIBERWR #2 38 T-5 BLUE (SUTURE) ×2 IMPLANT
TOWEL OR 17X24 6PK STRL BLUE (TOWEL DISPOSABLE) ×3 IMPLANT
TOWEL OR 17X26 10 PK STRL BLUE (TOWEL DISPOSABLE) ×15 IMPLANT
TUBE CONNECTING 12X1/4 (SUCTIONS) ×6 IMPLANT
TUBING CYSTO DISP (UROLOGICAL SUPPLIES) ×3 IMPLANT
WASHER 3.5MM (Orthopedic Implant) ×6 IMPLANT
YANKAUER SUCT BULB TIP NO VENT (SUCTIONS) ×6 IMPLANT
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
## 2012-04-17 NOTE — Anesthesia Postprocedure Evaluation (Signed)
  Anesthesia Post-op Note  Patient: Pensions consultant  Procedure(s) Performed: Procedure(s) (LRB): INTRAMEDULLARY (IM) NAIL TIBIAL (Right) OPEN REDUCTION INTERNAL FIXATION (ORIF) DISTAL HUMERUS FRACTURE (Left)  Patient Location: PACU  Anesthesia Type: General  Level of Consciousness: oriented, sedated and patient cooperative  Airway and Oxygen Therapy: Patient Spontanous Breathing and Patient connected to nasal cannula oxygen  Post-op Pain: mild  Post-op Assessment: Post-op Vital signs reviewed, Patient's Cardiovascular Status Stable, Respiratory Function Stable, Patent Airway, No signs of Nausea or vomiting and Pain level controlled  Post-op Vital Signs: stable  Complications: No apparent anesthesia complications

## 2012-04-17 NOTE — Preoperative (Signed)
Beta Blockers   Reason not to administer Beta Blockers:Not Applicable 

## 2012-04-17 NOTE — Anesthesia Procedure Notes (Signed)
Procedure Name: Intubation Date/Time: 04/17/2012 11:21 AM Performed by: Gwenyth Allegra Pre-anesthesia Checklist: Emergency Drugs available, Patient identified, Timeout performed, Suction available and Patient being monitored Patient Re-evaluated:Patient Re-evaluated prior to inductionOxygen Delivery Method: Circle system utilized Preoxygenation: Pre-oxygenation with 100% oxygen Intubation Type: IV induction Ventilation: Mask ventilation without difficulty Grade View: Grade II Tube type: Oral Tube size: 7.0 mm Number of attempts: 1 Airway Equipment and Method: Stylet Placement Confirmation: ETT inserted through vocal cords under direct vision,  breath sounds checked- equal and bilateral and positive ETCO2 Secured at: 21 cm Tube secured with: Tape Dental Injury: Teeth and Oropharynx as per pre-operative assessment
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
## 2012-04-17 NOTE — Transfer of Care (Signed)
Immediate Anesthesia Transfer of Care Note  Patient: Ana [name]  Procedure(s) Performed: Procedure(s) (LRB): INTRAMEDULLARY (IM) NAIL TIBIAL (Right) OPEN REDUCTION INTERNAL FIXATION (ORIF) DISTAL HUMERUS FRACTURE (Left)  Patient Location: PACU  Anesthesia Type: General  Level of Consciousness: awake, alert , oriented and sedated  Airway & Oxygen Therapy: Patient Spontanous Breathing and Patient connected to face mask oxygen  Post-op Assessment: Report given to PACU RN and Post -op Vital signs reviewed and stable  Post vital signs: Reviewed and stable  Complications: No apparent anesthesia complications
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
## 2012-04-17 NOTE — Progress Notes (Addendum)
Called by nursing for post-op orders.  Multiple injuries and issues, but I have addressed them to my understanding of this complex patient.  NWB LLE, NWB RUE, will maintain TLSO, bed rest with spine precautions.  I have cancelled the PT/OT orders for now until her activity restrictions are clarified.  Asked nursing to clarify with Dr. Jordan Likes (her neurosurgeon) regarding spine questions:   1.  Is lovenox ok to begin tomorrow given spine fracture (I have ordered this so far)? 2.  Is the cervical spine cleared?  (based on his note it did not appear so) 3.  What is the activity status from the spine standpoint now that the ortho injuries have been stabilized?  Dr. Charlann Boxer is the primary orthopedic surgeons managing her leg, Dr. Mina Marble is managing her upper extremity, and I assisted with the upper extremity and am currently on unassigned ortho call.  I will defer the duration of antibiotics to Drs. Weingold and [place], although Dr. Lazarus Salines recommended antibiotics for her sinus fractures, but did not specify duration.    ADDENDUM:  Nursing spoken with Dr. Jordan Likes:  1.  c-spine is per trauma team 2.  lovenox is ok 3.  Mobilize in TLSO and ok for head of bed to 30 degrees.  Eulas Post, MD

## 2012-04-17 NOTE — Progress Notes (Signed)
Orthopedic Tech Progress Note Patient Details:  Ana [name] [birth year] [id number]  Patient ID: Ana [name], female   DOB: June 13, 1981, 31 y.o.   MRN: [id number] Contacted [employer] for brace order.  Khloie Hamada T 04/17/2012, 10:58 AM

## 2012-04-17 NOTE — ED Notes (Signed)
Returned from CT.

## 2012-04-17 NOTE — Brief Op Note (Signed)
04/17/2012  12:50 PM  PATIENT:  Ana [name]  31 y.o. female  PRE-OPERATIVE DIAGNOSIS:  open right tibia/fibula fractures; open left elbow and closed left forearm fractures; left leg lacerations  POST-OPERATIVE DIAGNOSIS:  open right tibia/fibula fractures; open left elbow and closed left forearm fractures; left leg lacerations  PROCEDURE:  Procedure(s) (LRB): 1. Incisional debridement of left distal anterior thigh laceration 2-3cm, skin subcutaneous tissue, primary wound closure, 2. Incisional debridement right anterior distal thigh laceration 6-8cm, skin subcutaneous tissue and closed at conclusion of the case.  3. Incisional debridement of anterior lag wound at fracture site 2-3cm incision, skin, subcutaneous tissue, periosteum, bone fragments, with primary closure at conclusion of case. 4. ORIF right tibia fracture (using [employer] tibial nail - 9mm X 33cm 2 distal interlocking screws, one proximally) INTRAMEDULLARY (IM) NAIL TIBIAL (Right) OPEN REDUCTION INTERNAL FIXATION (ORIF) DISTAL HUMERUS FRACTURE (Left) and left both bone forearm fractures - dictated separately by Dr. Mina Marble   SURGEON:  Surgeon(s) and Role:    * Shelda Pal, MD - Primary    * Marlowe Shores, MD - Assisting    * Toni Arthurs, MD - Assisting    * Eulas Post, MD - Assisting  PHYSICIAN ASSISTANT: None  ANESTHESIA:   general  EBL:  Total I/O In: 1250 [I.V.:1000; IV Piggyback:250] Out: 190 [Urine:190]  BLOOD ADMINISTERED:none  DRAINS: none   LOCAL MEDICATIONS USED:  NONE  SPECIMEN:  No Specimen  DISPOSITION OF SPECIMEN:  N/A  COUNTS:  YES  TOURNIQUET:  * Missing tourniquet times found for documented tourniquets in log:  [id number] *  DICTATION: .Other Dictation: Dictation Number [id number]  PLAN OF CARE: Admit to inpatient   PATIENT DISPOSITION:  PACU - hemodynamically stable.   Delay start of Pharmacological VTE agent (>24hrs) due to surgical blood loss or risk of bleeding: yes
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
## 2012-04-17 NOTE — ED Notes (Signed)
Patient transported to CT 

## 2012-04-17 NOTE — Anesthesia Preprocedure Evaluation (Signed)
Anesthesia Evaluation  Patient identified by MRN, date of birth, ID band Patient awake    Reviewed: Allergy & Precautions, H&P , NPO status , Patient's Chart, lab work & pertinent test results  Airway Mallampati: I TM Distance: >3 FB Neck ROM: full    Dental   Pulmonary          Cardiovascular Rhythm:regular Rate:Normal     Neuro/Psych    GI/Hepatic   Endo/Other    Renal/GU      Musculoskeletal   Abdominal   Peds  Hematology   Anesthesia Other Findings   Reproductive/Obstetrics                           Anesthesia Physical Anesthesia Plan  ASA: I  Anesthesia Plan: General   Post-op Pain Management:    Induction: Intravenous  Airway Management Planned: Oral ETT  Additional Equipment:   Intra-op Plan:   Post-operative Plan: Extubation in OR  Informed Consent: I have reviewed the patients History and Physical, chart, labs and discussed the procedure including the risks, benefits and alternatives for the proposed anesthesia with the patient or authorized representative who has indicated his/her understanding and acceptance.     Plan Discussed with: CRNA, Anesthesiologist and Surgeon  Anesthesia Plan Comments:         Anesthesia Quick Evaluation  

## 2012-04-18 ENCOUNTER — Inpatient Hospital Stay (HOSPITAL_COMMUNITY): Payer: 59

## 2012-04-18 LAB — CBC
MCH: 29.7 pg (ref 26.0–34.0)
MCV: 84.8 fL (ref 78.0–100.0)
Platelets: 141 10*3/uL — ABNORMAL LOW (ref 150–400)
RBC: 2.83 MIL/uL — ABNORMAL LOW (ref 3.87–5.11)

## 2012-04-18 LAB — BASIC METABOLIC PANEL
CO2: 25 mEq/L (ref 19–32)
Calcium: 7.6 mg/dL — ABNORMAL LOW (ref 8.4–10.5)
Glucose, Bld: 119 mg/dL — ABNORMAL HIGH (ref 70–99)
Sodium: 138 mEq/L (ref 135–145)

## 2012-04-18 MED ORDER — WHITE PETROLATUM GEL
Status: AC
  Administered 2012-04-18: 12:00:00
  Filled 2012-04-18: qty 5

## 2012-04-18 NOTE — Progress Notes (Signed)
Subjective: 1 Day Post-Op Procedure(s) (LRB): INTRAMEDULLARY (IM) NAIL TIBIAL (Right) OPEN REDUCTION INTERNAL FIXATION (ORIF) DISTAL HUMERUS FRACTURE (Left) Patient reports pain as moderate.    Objective: Vital signs in last 24 hours: Temp:  [98.4 F (36.9 C)-100.1 F (37.8 C)] 98.7 F (37.1 C) (08/04 0804) Pulse Rate:  [87-131] 128  (08/04 0900) Resp:  [12-23] 16  (08/04 0900) BP: (120-130)/(75-91) 120/79 mmHg (08/04 0900) SpO2:  [100 %] 100 % (08/04 0900)  Intake/Output from previous day: 08/03 0701 - 08/04 0700 In: 5255 [P.O.:480; I.V.:4425; IV Piggyback:350] Out: 3185 [Urine:2985; Blood:200] Intake/Output this shift: Total I/O In: 260 [P.O.:60; I.V.:150; IV Piggyback:50] Out: 350 [Urine:350]   Basename 04/18/12 0505 04/17/12 1600 04/17/12 0517 04/17/12 0511  HGB 8.4* 8.5* 12.2 11.7*    Basename 04/18/12 0505 04/17/12 1600  WBC 11.0* 10.7*  RBC 2.83* 2.92*  HCT 24.0* 24.4*  PLT 141* 148*    Basename 04/18/12 0505 04/17/12 1600  NA 138 140  K 3.4* 5.3*  CL 103 107  CO2 25 23  BUN 5* 6  CREATININE 0.59 0.59  GLUCOSE 119* 139*  CALCIUM 7.6* 7.6*    Basename 04/17/12 0511  LABPT --  INR 1.25    Neurologically intact  Assessment/Plan: 1 Day Post-Op Procedure(s) (LRB): INTRAMEDULLARY (IM) NAIL TIBIAL (Right) OPEN REDUCTION INTERNAL FIXATION (ORIF) DISTAL HUMERUS FRACTURE (Left) Will follow with trauma   Will need to see in my office this week if discharged  No weight bearing on left side  Leasha Goldberger A 04/18/2012, 10:33 AM

## 2012-04-18 NOTE — Progress Notes (Signed)
Awake and alert. Hemodynamically stable. Complains of some lower back pain in addition to her chest wall and extremity pain. No complaints of numbness paresthesias or weakness.  On exam she appears to have normal strength in her left lower extremity. She wiggles her toes on her right r lower extremity well. Sensory exam normal.  L3-an injury with probable posterior ligamentous injury and superior facet fractures bilaterally. Overall stability questionable. Will treat him TLSO for now. Mobilize in the brace as she is able given her orthopedic injuries. Once feasible I will like to get up right lateral lumbar spine x-rays to assess her sagittal balance and overall stability.

## 2012-04-18 NOTE — Progress Notes (Signed)
Orthopedic Tech Progress Note Patient Details:  Ana [name] 1981-01-20 [id number]  Patient ID: Ana [name], female   DOB: 09-Jul-1981, 31 y.o.   MRN: [id number] Confirmed patient has received TLSO brace.  Nijee Heatwole T 04/18/2012, 11:54 AM

## 2012-04-18 NOTE — Progress Notes (Signed)
    Subjective: 1 Day Post-Op Procedure(s) (LRB): INTRAMEDULLARY (IM) NAIL TIBIAL (Right) OPEN REDUCTION INTERNAL FIXATION (ORIF) DISTAL HUMERUS FRACTURE (Left) Patient reports overall pain as moderate.  Complains of low back pain.  Denies CP or SOB.  Voiding without difficulty. Positive flatus.  Objective: Vital signs in last 24 hours: Temp:  [98.4 F (36.9 C)-100.1 F (37.8 C)] 98.7 F (37.1 C) (08/04 0804) Pulse Rate:  [87-131] 128  (08/04 0900) Resp:  [12-23] 16  (08/04 0900) BP: (109-130)/(67-91) 120/79 mmHg (08/04 0900) SpO2:  [97 %-100 %] 100 % (08/04 0900)  Intake/Output from previous day: 08/03 0701 - 08/04 0700 In: 5255 [P.O.:480; I.V.:4425; IV Piggyback:350] Out: 3185 [Urine:2985; Blood:200] Intake/Output this shift: Total I/O In: 260 [P.O.:60; I.V.:150; IV Piggyback:50] Out: 350 [Urine:350]  Labs:  [facility] 04/18/12 0505 04/17/12 1600 04/17/12 0517 04/17/12 0511  HGB 8.4* 8.5* 12.2 11.7*    Basename 04/18/12 0505 04/17/12 1600  WBC 11.0* 10.7*  RBC 2.83* 2.92*  HCT 24.0* 24.4*  PLT 141* 148*    Basename 04/18/12 0505 04/17/12 1600  NA 138 140  K 3.4* 5.3*  CL 103 107  CO2 25 23  BUN 5* 6  CREATININE 0.59 0.59  GLUCOSE 119* 139*  CALCIUM 7.6* 7.6*    Basename 04/17/12 0511  LABPT --  INR 1.25    Physical Exam: Neurologically intact ABD soft Neurovascular intact Dorsiflexion/Plantar flexion intact Incision: dressing C/D/I Compartment soft  Assessment/Plan: 1 Day Post-Op Procedure(s) (LRB): INTRAMEDULLARY (IM) NAIL TIBIAL (Right) OPEN REDUCTION INTERNAL FIXATION (ORIF) DISTAL HUMERUS FRACTURE (Left) Physical exam and plan per Dr. Shon Baton Plan per trauma Dr. Victorino Dike to eval in the am no new recommendation   Gwinda Maine for Dr. Venita Lick [facility] [phone] 04/18/2012, 9:44 AM

## 2012-04-18 NOTE — Consult Note (Signed)
04/18/2012 1:33 PM  Duffy Rhody, Josanne [id number]  Post trauma day 1    Temp:  [98.4 F (36.9 C)-100.1 F (37.8 C)] 98.8 F (37.1 C) (08/04 1202) Pulse Rate:  [87-154] 154  (08/04 1300) Resp:  [12-23] 19  (08/04 1300) BP: (120-130)/(74-91) 129/79 mmHg (08/04 1300) SpO2:  [100 %] 100 % (08/04 1300),     Intake/Output Summary (Last 24 hours) at 04/18/12 1333 Last data filed at 04/18/12 1200  Gross per 24 hour  Intake   4745 ml  Output   3570 ml  Net   1175 ml    Results for orders placed during the hospital encounter of 04/17/12 (from the past 24 hour(s))  CBC     Status: Abnormal   Collection Time   04/17/12  4:00 PM      Component Value Range   WBC 10.7 (*) 4.0 - 10.5 K/uL   RBC 2.92 (*) 3.87 - 5.11 MIL/uL   Hemoglobin 8.5 (*) 12.0 - 15.0 g/dL   HCT 56.2 (*) 13.0 - 86.5 %   MCV 83.6  78.0 - 100.0 fL   MCH 29.1  26.0 - 34.0 pg   MCHC 34.8  30.0 - 36.0 g/dL   RDW 78.4  69.6 - 29.5 %   Platelets 148 (*) 150 - 400 K/uL  BASIC METABOLIC PANEL     Status: Abnormal   Collection Time   04/17/12  4:00 PM      Component Value Range   Sodium 140  135 - 145 mEq/L   Potassium 5.3 (*) 3.5 - 5.1 mEq/L   Chloride 107  96 - 112 mEq/L   CO2 23  19 - 32 mEq/L   Glucose, Bld 139 (*) 70 - 99 mg/dL   BUN 6  6 - 23 mg/dL   Creatinine, Ser 2.84  0.50 - 1.10 mg/dL   Calcium 7.6 (*) 8.4 - 10.5 mg/dL   GFR calc non Af Amer >90  >90 mL/min   GFR calc Af Amer >90  >90 mL/min  BASIC METABOLIC PANEL     Status: Abnormal   Collection Time   04/18/12  5:05 AM      Component Value Range   Sodium 138  135 - 145 mEq/L   Potassium 3.4 (*) 3.5 - 5.1 mEq/L   Chloride 103  96 - 112 mEq/L   CO2 25  19 - 32 mEq/L   Glucose, Bld 119 (*) 70 - 99 mg/dL   BUN 5 (*) 6 - 23 mg/dL   Creatinine, Ser 1.32  0.50 - 1.10 mg/dL   Calcium 7.6 (*) 8.4 - 10.5 mg/dL   GFR calc non Af Amer >90  >90 mL/min   GFR calc Af Amer >90  >90 mL/min  CBC     Status: Abnormal   Collection Time   04/18/12  5:05 AM      Component  Value Range   WBC 11.0 (*) 4.0 - 10.5 K/uL   RBC 2.83 (*) 3.87 - 5.11 MIL/uL   Hemoglobin 8.4 (*) 12.0 - 15.0 g/dL   HCT 44.0 (*) 10.2 - 72.5 %   MCV 84.8  78.0 - 100.0 fL   MCH 29.7  26.0 - 34.0 pg   MCHC 35.0  30.0 - 36.0 g/dL   RDW 36.6  44.0 - 34.7 %   Platelets 141 (*) 150 - 400 K/uL    Denies vision or hearing issues. Jaws tender but teeth feel OK.  No breathing difficulty.  Nose congested.  OBJECTIVE:  EOMI.  Vision intact OU.  Mod facial edema.  Cannot assess nose for deformity yet.  IMPRESSION/PLAN:  LEFT orbital blowout fx's which will likely not need any repair.  Nasal fx's per CT, may require closed reduction next week.  Discussed with patient and family.  Need Ophth consult.   Flo Shanks

## 2012-04-18 NOTE — Progress Notes (Signed)
PT Cancellation Note  Treatment cancelled today due to noted in Dr Shelba Flake note that pt is on bedrest and he cancelled PT/OT orders, however PT had been ordered twice and only discontinued once.  Please clarify activity level desired and all precautions if pt is appropriate for PT/OT evals.  Thanks.    Savaya Hakes, Alison Murray 04/18/2012, 7:27 AM
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
## 2012-04-19 ENCOUNTER — Encounter (HOSPITAL_COMMUNITY): Payer: Self-pay | Admitting: Orthopedic Surgery

## 2012-04-19 LAB — BASIC METABOLIC PANEL
Calcium: 8.2 mg/dL — ABNORMAL LOW (ref 8.4–10.5)
GFR calc Af Amer: >90 mL/min (ref >90)
GFR calc non Af Amer: >90 mL/min (ref >90)
Glucose, Bld: 142 mg/dL — ABNORMAL HIGH (ref 70–99)
Sodium: 136 mEq/L (ref 135–145)

## 2012-04-19 LAB — CBC WITH DIFFERENTIAL/PLATELET
Eosinophils Absolute: 0.2 10*3/uL (ref 0.0–0.7)
Lymphs Abs: 1.2 10*3/uL (ref 0.7–4.0)
MCH: 29.3 pg (ref 26.0–34.0)
Neutrophils Relative %: 81 % — ABNORMAL HIGH (ref 43–77)
Platelets: 123 10*3/uL — ABNORMAL LOW (ref 150–400)
RBC: 2.46 MIL/uL — ABNORMAL LOW (ref 3.87–5.11)
WBC: 10.4 10*3/uL (ref 4.0–10.5)

## 2012-04-19 MED ORDER — BIOTENE DRY MOUTH MT LIQD
15.0000 mL | Freq: Two times a day (BID) | OROMUCOSAL | Status: DC
  Administered 2012-04-19 – 2012-04-27 (×16): 15 mL via OROMUCOSAL
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
## 2012-04-19 NOTE — Progress Notes (Signed)
UR complete 

## 2012-04-19 NOTE — Progress Notes (Signed)
2 Days Post-Op  Subjective: Still with tachycardia. No respiratory issues over night. Pain control improving  Objective: Vital signs in last 24 hours: Temp:  [98.7 F (37.1 C)-100.8 F (38.2 C)] 99.9 F (37.7 C) (08/05 0400) Pulse Rate:  [123-154] 139  (08/05 0600) Resp:  [13-26] 20  (08/05 0600) BP: (112-129)/(53-90) 127/70 mmHg (08/05 0600) SpO2:  [99 %-100 %] 100 % (08/05 0600)    Intake/Output from previous day: 08/04 0701 - 08/05 0700 In: 3440 [P.O.:1140; I.V.:2200; IV Piggyback:100] Out: 3410 [Urine:3410] Intake/Output this shift:    Gen:  Comfortable, arousable, just medicated Lungs:  Decrease left base, rate normal CV:  Tachy, sinus rhythm Abdomen:  Mild tenderness lower abdomen, soft, non distended Ext:  Dressings/splints intact, moves toes and fingers  Lab Results:   Basename 04/18/12 0505 04/17/12 1600  WBC 11.0* 10.7*  HGB 8.4* 8.5*  HCT 24.0* 24.4*  PLT 141* 148*   BMET  Basename 04/18/12 0505 04/17/12 1600  NA 138 140  K 3.4* 5.3*  CL 103 107  CO2 25 23  GLUCOSE 119* 139*  BUN 5* 6  CREATININE 0.59 0.59  CALCIUM 7.6* 7.6*   PT/INR  Basename 04/17/12 0511  LABPROT 16.0*  INR 1.25   ABG No results found for this basename: PHART:2,PCO2:2,PO2:2,HCO3:2 in the last 72 hours  Studies/Results: Dg Tibia/fibula Right  04/17/2012  *RADIOLOGY REPORT*  Clinical Data: Recent tibial rod placement  RIGHT TIBIA AND FIBULA - 2 VIEW  Comparison: Films from earlier in the same day.  Findings: There has been placement of a medullary rod within the tibia.  Proximal and distal fixation screws are identified.  The tibial fracture fragments are in near anatomic alignment.  There is also been reduction of the fibular fracture as well.  Original Report Authenticated By: Phillips Odor, M.D.   Dg Chest Port 1 View  04/18/2012  *RADIOLOGY REPORT*  Clinical Data: Decreased breath sounds in the right lung.  PORTABLE CHEST - 1 VIEW  Comparison: Chest x-ray 04/17/2012.   Findings: Multiple left-sided rib fractures are incompletely visualized on today's examination.  Lungs are well expanded bilaterally, without consolidative airspace disease, pleural effusions, or definite pneumothorax.  Pulmonary vasculature and the cardiomediastinal silhouette are within normal limits.  IMPRESSION: 1.  No radiographic evidence of acute cardiopulmonary disease. 2.  Multiple left-sided rib fractures again noted (these are incompletely evaluated).  See CT scan 04/17/2012 for further details.  Original Report Authenticated By: Florencia Reasons, M.D.    Anti-infectives: Anti-infectives     Start     Dose/Rate Route Frequency Ordered Stop   04/17/12 2000   ceFAZolin (ANCEF) IVPB 1 g/50 mL premix        1 g 100 mL/hr over 30 Minutes Intravenous Every 6 hours 04/17/12 1832 04/18/12 0842   04/17/12 0630   ceFAZolin (ANCEF) IVPB 1 g/50 mL premix  Status:  Discontinued        1 g 100 mL/hr over 30 Minutes Intravenous 3 times per day 04/17/12 0630 04/17/12 1832   04/17/12 0615   ceFAZolin (ANCEF) IVPB 2 g/50 mL premix        2 g 100 mL/hr over 30 Minutes Intravenous  Once 04/17/12 0600 04/17/12 1110          Assessment/Plan: s/p Procedure(s) (LRB): INTRAMEDULLARY (IM) NAIL TIBIAL (Right) OPEN REDUCTION INTERNAL FIXATION (ORIF) DISTAL HUMERUS FRACTURE (Left) MULTIPLE LEFT RIB FRACTURES FACIAL FRACTURES L SPINE INJURY  Continue foley cath for UOP, limited mobility Pulmonary toilet.  Repeat  CXR in am Continue lovenox for DVT prophylaxis Advance po as tolerated PT/OT - activity per Ortho and Neuro Continue ICU management given tachycardia and pain control issues  LOS: 2 days    Sonika Levins A 04/19/2012

## 2012-04-19 NOTE — Clinical Social Work Note (Signed)
Clinical Social Worker received referral regarding concerns for patient 28 month old son and formula availability.  CSW attempted to speak with patient at bedside, however patient remains disengaged in conversation due to medical conditions.  Patient is in a great deal of pain and awaiting pain medication.  Patient requested CSW return at a later date.  Patient did state that her 38 month old son would need [employer] Start formula.  CSW spoke with the child's father in 2300 waiting area who states that there was not a need at this time.  Patient's son's father and patient's father will be caring for the child during patient hospitalization.  Clinical Social Worker spoke with patient mother in 2300 waiting area to discuss current living arrangements due to concerns addressed from RN.  Patient mother states that her and patient's younger sister have been sleeping in their car due to the inability to afford somewhere to stay.  CSW linked patient mother to [facility] who made arrangements with patient mother to stay at the Home in [place] owned by the hospital.  Patient family will now have adequate access to showers, beds, and necessities.  Patient mother states that patient also has 26 and 66 year old children who are safely with their father in [place].  Clinical Social Worker will follow up with patient 08/06 to complete full assessment.  CSW available for emotional support as needed.  Macario Golds, [place] [phone]

## 2012-04-19 NOTE — Progress Notes (Signed)
No new issues. Patient hemodynamically stable. Pain control still an issue but that her. Patient does note back pain but does not describe pain which seems radicular. No complaints of numbness or dysesthesias. No complaints of weakness.  Examination her left lower chamois is normal. Right lower chamois is limited secondary to her fracture.  L3-flexion/Chance fracture mobilize in TLSO for now. In a couple days we will check upright x-rays to assess for progressive kyphotic angulation.

## 2012-04-19 NOTE — Progress Notes (Signed)
OT Cancellation Note Treatment cancelled today due to pt just finished with PT. OT will check back on pt later in the day or next day as time allows. Thanks,  Alba Cory 04/19/2012, 12:26 PM

## 2012-04-20 ENCOUNTER — Inpatient Hospital Stay (HOSPITAL_COMMUNITY): Payer: 59

## 2012-04-20 ENCOUNTER — Encounter (HOSPITAL_COMMUNITY): Payer: Self-pay | Admitting: Physical Medicine and Rehabilitation

## 2012-04-20 DIAGNOSIS — S82209A Unspecified fracture of shaft of unspecified tibia, initial encounter for closed fracture: Secondary | ICD-10-CM

## 2012-04-20 DIAGNOSIS — S069X9A Unspecified intracranial injury with loss of consciousness of unspecified duration, initial encounter: Secondary | ICD-10-CM

## 2012-04-20 DIAGNOSIS — S0285XA Fracture of orbit, unspecified, initial encounter for closed fracture: Secondary | ICD-10-CM | POA: Diagnosis present

## 2012-04-20 DIAGNOSIS — S82409A Unspecified fracture of shaft of unspecified fibula, initial encounter for closed fracture: Secondary | ICD-10-CM

## 2012-04-20 DIAGNOSIS — S71112A Laceration without foreign body, left thigh, initial encounter: Secondary | ICD-10-CM | POA: Diagnosis present

## 2012-04-20 DIAGNOSIS — S32009A Unspecified fracture of unspecified lumbar vertebra, initial encounter for closed fracture: Secondary | ICD-10-CM

## 2012-04-20 DIAGNOSIS — S82201B Unspecified fracture of shaft of right tibia, initial encounter for open fracture type I or II: Secondary | ICD-10-CM | POA: Diagnosis present

## 2012-04-20 DIAGNOSIS — D62 Acute posthemorrhagic anemia: Secondary | ICD-10-CM | POA: Diagnosis not present

## 2012-04-20 DIAGNOSIS — S060X9A Concussion with loss of consciousness of unspecified duration, initial encounter: Secondary | ICD-10-CM

## 2012-04-20 DIAGNOSIS — S52209A Unspecified fracture of shaft of unspecified ulna, initial encounter for closed fracture: Secondary | ICD-10-CM

## 2012-04-20 DIAGNOSIS — S2243XA Multiple fractures of ribs, bilateral, initial encounter for closed fracture: Secondary | ICD-10-CM | POA: Diagnosis present

## 2012-04-20 DIAGNOSIS — S0240DA Maxillary fracture, left side, initial encounter for closed fracture: Secondary | ICD-10-CM | POA: Diagnosis present

## 2012-04-20 DIAGNOSIS — IMO0002 Reserved for concepts with insufficient information to code with codable children: Secondary | ICD-10-CM | POA: Diagnosis present

## 2012-04-20 DIAGNOSIS — S32039A Unspecified fracture of third lumbar vertebra, initial encounter for closed fracture: Secondary | ICD-10-CM | POA: Diagnosis present

## 2012-04-20 DIAGNOSIS — S52309A Unspecified fracture of shaft of unspecified radius, initial encounter for closed fracture: Secondary | ICD-10-CM

## 2012-04-20 LAB — CBC
Hemoglobin: 6.8 g/dL — CL (ref 12.0–15.0)
MCH: 29.3 pg (ref 26.0–34.0)
RBC: 2.32 MIL/uL — ABNORMAL LOW (ref 3.87–5.11)

## 2012-04-20 LAB — BASIC METABOLIC PANEL
GFR calc Af Amer: >90 mL/min (ref >90)
GFR calc non Af Amer: >90 mL/min (ref >90)
Glucose, Bld: 116 mg/dL — ABNORMAL HIGH (ref 70–99)
Potassium: 3.5 mEq/L (ref 3.5–5.1)
Sodium: 139 mEq/L (ref 135–145)

## 2012-04-20 LAB — ABO/RH: ABO/RH(D): A POS

## 2012-04-20 LAB — PREPARE RBC (CROSSMATCH)

## 2012-04-20 MED ORDER — LORATADINE 10 MG PO TABS
10.0000 mg | ORAL_TABLET | Freq: Every day | ORAL | Status: DC | PRN
  Filled 2012-04-20: qty 1

## 2012-04-20 MED ORDER — HYDROMORPHONE HCL PF 1 MG/ML IJ SOLN
1.0000 mg | INTRAMUSCULAR | Status: DC | PRN
  Administered 2012-04-20 – 2012-04-21 (×3): 1 mg via INTRAVENOUS
  Filled 2012-04-20 (×4): qty 1

## 2012-04-20 MED ORDER — OXYCODONE HCL 5 MG PO TABS
10.0000 mg | ORAL_TABLET | ORAL | Status: DC | PRN
  Administered 2012-04-20: 15 mg via ORAL
  Administered 2012-04-20: 10 mg via ORAL
  Administered 2012-04-21 (×2): 15 mg via ORAL
  Filled 2012-04-20 (×3): qty 3
  Filled 2012-04-20: qty 2

## 2012-04-20 MED ORDER — POLYETHYLENE GLYCOL 3350 17 G PO PACK
17.0000 g | PACK | Freq: Every day | ORAL | Status: DC
  Administered 2012-04-20 – 2012-04-27 (×5): 17 g via ORAL
  Filled 2012-04-20 (×9): qty 1

## 2012-04-20 MED ORDER — ADULT MULTIVITAMIN W/MINERALS CH
1.0000 | ORAL_TABLET | Freq: Every day | ORAL | Status: DC
  Administered 2012-04-20 – 2012-04-27 (×7): 1 via ORAL
  Filled 2012-04-20 (×9): qty 1

## 2012-04-20 MED ORDER — MAGNESIUM CITRATE PO SOLN
1.0000 | Freq: Once | ORAL | Status: AC
  Administered 2012-04-20: 1 via ORAL
  Filled 2012-04-20: qty 296

## 2012-04-20 MED ORDER — TRAMADOL HCL 50 MG PO TABS
100.0000 mg | ORAL_TABLET | Freq: Four times a day (QID) | ORAL | Status: DC
  Administered 2012-04-20 – 2012-04-27 (×25): 100 mg via ORAL
  Filled 2012-04-20 (×26): qty 2

## 2012-04-20 MED ORDER — DOCUSATE SODIUM 100 MG PO CAPS
200.0000 mg | ORAL_CAPSULE | Freq: Two times a day (BID) | ORAL | Status: DC
  Administered 2012-04-20 – 2012-04-27 (×14): 200 mg via ORAL
  Filled 2012-04-20 (×18): qty 2
  Filled 2012-04-20: qty 1
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
## 2012-04-20 NOTE — Progress Notes (Signed)
No new issues. Still with recently intense mid lumbar pain without radiation. Neurologic exam intact.  Pain level is making mobilization very difficult. Motor and sensory function intact.  L3 fracture. I would like to get up right lateral x-rays of possible. Still evaluating as to whether this fracture can be managed with bracing alone versus operative fixation and fusion.

## 2012-04-20 NOTE — Progress Notes (Signed)
CRITICAL VALUE ALERT  Critical value received:  Hgb=6.8  Date of notification:  04/20/12  Time of notification:  0506  Critical value read back:yes  Nurse who received alert:  Dustin Flock RN   MD notified (1st page):  Dr Donell Beers  Time of first page: 0545   MD notified (2nd page):  Time of second page:  Responding MD:  Dr Donell Beers  Time MD responded:  [phone]

## 2012-04-20 NOTE — Progress Notes (Signed)
Orthopedic Tech Progress Note Patient Details:  Ana [name] May 13, 1981 [id number]  Patient ID: Ana [name], female   DOB: October 10, 1980, 31 y.o.   MRN: [id number]   Ana [name] 04/20/2012, 11:17 AM TLSO BRACE COMPLETED BY BIO TECH
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
## 2012-04-20 NOTE — Progress Notes (Signed)
Patient ID: Ana [name], female   DOB: 09/05/1981, 31 y.o.   MRN: [id number] Subjective: 3 Days Post-Op Procedure(s) (LRB): INTRAMEDULLARY (IM) NAIL TIBIAL (Right) OPEN REDUCTION INTERNAL FIXATION (ORIF) DISTAL HUMERUS FRACTURE (Left)    Patient reports pain as mild with regards to right leg and left arm, but still significant pain in her back  Objective:   VITALS:   Filed Vitals:   04/20/12 0500  BP: 110/52  Pulse: 110  Temp:   Resp: 15    Neurologically intact Incision: dressing C/D/I Awake alert talkative, pleasant  LABS  Basename 04/20/12 0405 04/19/12 1009 04/18/12 0505  HGB 6.8* 7.2* 8.4*  HCT 19.5* 20.7* 24.0*  WBC 9.6 10.4 11.0*  PLT 150 123* 141*     Basename 04/20/12 0405 04/19/12 1009 04/18/12 0505  NA 139 136 138  K 3.5 3.3* 3.4*  BUN 4* 3* 5*  CREATININE 0.53 0.56 0.59  GLUCOSE 116* 142* 119*    No results found for this basename: LABPT:2,INR:2 in the last 72 hours   Assessment/Plan: 3 Days Post-Op Procedure(s) (LRB): INTRAMEDULLARY (IM) NAIL TIBIAL (Right) OPEN REDUCTION INTERNAL FIXATION (ORIF) DISTAL HUMERUS FRACTURE (Left)   Up with therapy, as tolerated and per NSU PWB right lower extremity

## 2012-04-20 NOTE — Progress Notes (Signed)
Repeat cbc in am to monitor anemia.   Ana [name]. Andrey Campanile, MD, FACS General, Bariatric, & Minimally Invasive Surgery [facility], [place]
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
## 2012-04-20 NOTE — Progress Notes (Signed)
Patient is Ana [name], 31 year old female.  There is tension between patient's parents.   Chaplain provided pastoral presence, prayer, and conversation to patient's mother [name].  She is a resident of [facility] while patient is at [employer].  [name] was very Human resources officer.  I will follow up as needed.

## 2012-04-21 ENCOUNTER — Encounter (HOSPITAL_COMMUNITY): Payer: Self-pay | Admitting: Anesthesiology

## 2012-04-21 ENCOUNTER — Inpatient Hospital Stay (HOSPITAL_COMMUNITY): Payer: 59

## 2012-04-21 ENCOUNTER — Encounter (HOSPITAL_COMMUNITY): Admission: EM | Disposition: A | Discharge status: Rehabilitation Facility | Payer: Self-pay | Source: Home / Self Care

## 2012-04-21 ENCOUNTER — Inpatient Hospital Stay (HOSPITAL_COMMUNITY): Payer: 59 | Admitting: Anesthesiology

## 2012-04-21 DIAGNOSIS — S022XXA Fracture of nasal bones, initial encounter for closed fracture: Secondary | ICD-10-CM | POA: Diagnosis present

## 2012-04-21 HISTORY — PX: CLOSED REDUCTION NASAL FRACTURE: SHX5365

## 2012-04-21 LAB — CBC WITH DIFFERENTIAL/PLATELET
Basophils Relative: 1 % (ref 0–1)
Eosinophils Absolute: 0.3 10*3/uL (ref 0.0–0.7)
Eosinophils Relative: 5 % (ref 0–5)
HCT: 17 % — ABNORMAL LOW (ref 36.0–46.0)
Hemoglobin: 5.8 g/dL — CL (ref 12.0–15.0)
Lymphs Abs: 1.2 10*3/uL (ref 0.7–4.0)
MCH: 28.9 pg (ref 26.0–34.0)
MCHC: 34.1 g/dL (ref 30.0–36.0)
MCV: 84.6 fL (ref 78.0–100.0)
Monocytes Absolute: 0.4 10*3/uL (ref 0.1–1.0)
Monocytes Relative: 7 % (ref 3–12)
Neutrophils Relative %: 68 % (ref 43–77)

## 2012-04-21 LAB — POCT I-STAT 7, (LYTES, BLD GAS, ICA,H+H)
Acid-Base Excess: 8 mmol/L — ABNORMAL HIGH (ref 0.0–2.0)
O2 Saturation: 100 %
Potassium: 4 mEq/L (ref 3.5–5.1)
Sodium: 136 mEq/L (ref 135–145)
TCO2: 34 mmol/L (ref 0–100)

## 2012-04-21 LAB — CBC
Hemoglobin: 11.2 g/dL — ABNORMAL LOW (ref 12.0–15.0)
MCH: 28.7 pg (ref 26.0–34.0)
MCH: 29.6 pg (ref 26.0–34.0)
MCHC: 34.1 g/dL (ref 30.0–36.0)
MCHC: 35.7 g/dL (ref 30.0–36.0)
MCV: 82.8 fL (ref 78.0–100.0)
MCV: 84.2 fL (ref 78.0–100.0)
Platelets: 190 10*3/uL (ref 150–400)
RBC: 3.79 MIL/uL — ABNORMAL LOW (ref 3.87–5.11)
RDW: 11.8 % (ref 11.5–15.5)

## 2012-04-21 LAB — APTT: aPTT: 34 seconds (ref 24–37)

## 2012-04-21 LAB — PROTIME-INR: INR: 1.24 (ref 0.00–1.49)

## 2012-04-21 SURGERY — POSTERIOR LUMBAR FUSION 4 LEVEL
Anesthesia: General | Site: Nose | Wound class: Clean

## 2012-04-21 MED ORDER — NEOSTIGMINE METHYLSULFATE 1 MG/ML IJ SOLN
INTRAMUSCULAR | Status: DC | PRN
  Administered 2012-04-21: 4 mg via INTRAVENOUS

## 2012-04-21 MED ORDER — MAGNESIUM CITRATE PO SOLN
1.0000 | Freq: Two times a day (BID) | ORAL | Status: DC
  Administered 2012-04-21: 1 via ORAL
  Filled 2012-04-21 (×16): qty 296

## 2012-04-21 MED ORDER — SODIUM CHLORIDE 0.9 % IJ SOLN: 9.0000 mL | INTRAMUSCULAR | Status: DC | PRN

## 2012-04-21 MED ORDER — ONDANSETRON HCL 4 MG/2ML IJ SOLN
INTRAMUSCULAR | Status: DC | PRN
  Administered 2012-04-21: 4 mg via INTRAVENOUS

## 2012-04-21 MED ORDER — PROPOFOL 10 MG/ML IV BOLUS
INTRAVENOUS | Status: DC | PRN
  Administered 2012-04-21: 200 mg via INTRAVENOUS

## 2012-04-21 MED ORDER — ROCURONIUM BROMIDE 100 MG/10ML IV SOLN
INTRAVENOUS | Status: DC | PRN
  Administered 2012-04-21: 20 mg via INTRAVENOUS
  Administered 2012-04-21: 10 mg via INTRAVENOUS
  Administered 2012-04-21: 50 mg via INTRAVENOUS
  Administered 2012-04-21: 10 mg via INTRAVENOUS

## 2012-04-21 MED ORDER — SODIUM CHLORIDE 0.9 % IV SOLN
INTRAVENOUS | Status: DC | PRN
  Administered 2012-04-21 (×2): via INTRAVENOUS

## 2012-04-21 MED ORDER — HYDROMORPHONE 0.3 MG/ML IV SOLN
INTRAVENOUS | Status: DC
  Administered 2012-04-21: 23:00:00 via INTRAVENOUS
  Administered 2012-04-22: 3.9 mg via INTRAVENOUS
  Administered 2012-04-22: 04:00:00 via INTRAVENOUS
  Administered 2012-04-22: 4.5 mg via INTRAVENOUS
  Administered 2012-04-22: 3.3 mg via INTRAVENOUS
  Filled 2012-04-21 (×2): qty 25

## 2012-04-21 MED ORDER — METHOCARBAMOL 750 MG PO TABS
1500.0000 mg | ORAL_TABLET | Freq: Four times a day (QID) | ORAL | Status: DC
  Administered 2012-04-21 – 2012-04-22 (×5): 1500 mg via ORAL
  Filled 2012-04-21: qty 2
  Filled 2012-04-21: qty 1
  Filled 2012-04-21 (×3): qty 2
  Filled 2012-04-21: qty 1
  Filled 2012-04-21 (×4): qty 2

## 2012-04-21 MED ORDER — OXYMETAZOLINE HCL 0.05 % NA SOLN
NASAL | Status: DC | PRN
  Administered 2012-04-21 (×2): 2 via NASAL

## 2012-04-21 MED ORDER — BISACODYL 10 MG RE SUPP
10.0000 mg | Freq: Once | RECTAL | Status: DC
  Filled 2012-04-21: qty 1

## 2012-04-21 MED ORDER — MAGNESIUM CITRATE PO SOLN: 1.0000 | Freq: Two times a day (BID) | ORAL | Status: DC

## 2012-04-21 MED ORDER — MAGNESIUM CITRATE PO SOLN
1.0000 | Freq: Two times a day (BID) | ORAL | Status: DC
  Filled 2012-04-21 (×16): qty 296

## 2012-04-21 MED ORDER — LACTATED RINGERS IV SOLN
INTRAVENOUS | Status: DC | PRN
  Administered 2012-04-21 (×3): via INTRAVENOUS

## 2012-04-21 MED ORDER — LIDOCAINE HCL (CARDIAC) 20 MG/ML IV SOLN
INTRAVENOUS | Status: DC | PRN
  Administered 2012-04-21: 80 mg via INTRAVENOUS

## 2012-04-21 MED ORDER — MORPHINE SULFATE ER 15 MG PO TBCR
15.0000 mg | EXTENDED_RELEASE_TABLET | Freq: Two times a day (BID) | ORAL | Status: DC
  Administered 2012-04-21: 15 mg via ORAL
  Filled 2012-04-21: qty 1

## 2012-04-21 MED ORDER — FENTANYL CITRATE 0.05 MG/ML IJ SOLN
INTRAMUSCULAR | Status: DC | PRN
  Administered 2012-04-21 (×8): 50 ug via INTRAVENOUS
  Administered 2012-04-21 (×2): 100 ug via INTRAVENOUS
  Administered 2012-04-21: 50 ug via INTRAVENOUS

## 2012-04-21 MED ORDER — GLYCOPYRROLATE 0.2 MG/ML IJ SOLN
INTRAMUSCULAR | Status: DC | PRN
  Administered 2012-04-21: .8 mg via INTRAVENOUS

## 2012-04-21 MED ORDER — ONDANSETRON HCL 4 MG/2ML IJ SOLN
4.0000 mg | Freq: Four times a day (QID) | INTRAMUSCULAR | Status: DC | PRN
  Filled 2012-04-21: qty 2

## 2012-04-21 MED ORDER — BETHANECHOL CHLORIDE 10 MG PO TABS
10.0000 mg | ORAL_TABLET | ORAL | Status: AC
  Administered 2012-04-21 (×2): 10 mg via ORAL
  Filled 2012-04-21 (×5): qty 1

## 2012-04-21 MED ORDER — DIPHENHYDRAMINE HCL 12.5 MG/5ML PO ELIX
12.5000 mg | ORAL_SOLUTION | Freq: Four times a day (QID) | ORAL | Status: DC | PRN
  Filled 2012-04-21: qty 5

## 2012-04-21 MED ORDER — NALOXONE HCL 0.4 MG/ML IJ SOLN
0.4000 mg | INTRAMUSCULAR | Status: DC | PRN
  Filled 2012-04-21: qty 1

## 2012-04-21 MED ORDER — MAGNESIUM CITRATE PO SOLN
1.0000 | Freq: Two times a day (BID) | ORAL | Status: DC
  Administered 2012-04-22: 1 via ORAL
  Filled 2012-04-21 (×3): qty 296

## 2012-04-21 MED ORDER — CALCIUM CHLORIDE 10 % IV SOLN
INTRAVENOUS | Status: DC | PRN
  Administered 2012-04-21 (×3): .2 g via INTRAVENOUS

## 2012-04-21 MED ORDER — 0.9 % SODIUM CHLORIDE (POUR BTL) OPTIME
TOPICAL | Status: DC | PRN
  Administered 2012-04-21: 1000 mL

## 2012-04-21 MED ORDER — DIPHENHYDRAMINE HCL 50 MG/ML IJ SOLN
12.5000 mg | Freq: Four times a day (QID) | INTRAMUSCULAR | Status: DC | PRN
  Administered 2012-04-24: 12.5 mg via INTRAVENOUS
  Filled 2012-04-21: qty 0.25
  Filled 2012-04-21 (×2): qty 1

## 2012-04-21 MED ORDER — THROMBIN 20000 UNITS EX KIT
PACK | CUTANEOUS | Status: DC | PRN
  Administered 2012-04-21: 19:00:00 via TOPICAL

## 2012-04-21 MED ORDER — SODIUM CHLORIDE 0.9 % IR SOLN
Status: DC | PRN
  Administered 2012-04-21: 19:00:00

## 2012-04-21 SURGICAL SUPPLY — 63 items
BAG DECANTER FOR FLEXI CONT (MISCELLANEOUS) ×3 IMPLANT
BENZOIN TINCTURE PRP APPL 2/3 (GAUZE/BANDAGES/DRESSINGS) ×3 IMPLANT
BLADE SURG ROTATE 9660 (MISCELLANEOUS) IMPLANT
BRUSH SCRUB EZ PLAIN DRY (MISCELLANEOUS) ×3 IMPLANT
BUR MATCHSTICK NEURO 3.0 LAGG (BURR) ×3 IMPLANT
CANISTER SUCTION 2500CC (MISCELLANEOUS) ×3 IMPLANT
CLOTH BEACON ORANGE TIMEOUT ST (SAFETY) ×3 IMPLANT
CONT SPEC 4OZ CLIKSEAL STRL BL (MISCELLANEOUS) ×6 IMPLANT
COVER BACK TABLE 24X17X13 BIG (DRAPES) IMPLANT
COVER TABLE BACK 60X90 (DRAPES) ×3 IMPLANT
CROSSLINK 41MM-10 (Trauma) ×1 IMPLANT
DECANTER SPIKE VIAL GLASS SM (MISCELLANEOUS) ×3 IMPLANT
DERMABOND ADVANCED (GAUZE/BANDAGES/DRESSINGS) ×1
DERMABOND ADVANCED .7 DNX12 (GAUZE/BANDAGES/DRESSINGS) ×2 IMPLANT
DRAPE C-ARM 42X72 X-RAY (DRAPES) ×6 IMPLANT
DRAPE LAPAROTOMY 100X72X124 (DRAPES) ×3 IMPLANT
DRAPE POUCH INSTRU U-SHP 10X18 (DRAPES) ×3 IMPLANT
DRAPE PROXIMA HALF (DRAPES) IMPLANT
DRAPE SURG 17X23 STRL (DRAPES) ×12 IMPLANT
ELECT REM PT RETURN 9FT ADLT (ELECTROSURGICAL) ×3
ELECTRODE REM PT RTRN 9FT ADLT (ELECTROSURGICAL) ×2 IMPLANT
EVACUATOR 1/8 PVC DRAIN (DRAIN) ×3 IMPLANT
GAUZE SPONGE 4X4 16PLY XRAY LF (GAUZE/BANDAGES/DRESSINGS) ×3 IMPLANT
GLOVE BIO SURGEON STRL SZ 6.5 (GLOVE) ×9 IMPLANT
GLOVE ECLIPSE 8.5 STRL (GLOVE) ×6 IMPLANT
GLOVE EXAM NITRILE LRG STRL (GLOVE) IMPLANT
GLOVE EXAM NITRILE MD LF STRL (GLOVE) IMPLANT
GLOVE EXAM NITRILE XL STR (GLOVE) IMPLANT
GLOVE EXAM NITRILE XS STR PU (GLOVE) IMPLANT
GLOVE INDICATOR 7.0 STRL GRN (GLOVE) ×12 IMPLANT
GOWN BRE IMP SLV AUR LG STRL (GOWN DISPOSABLE) ×12 IMPLANT
GOWN BRE IMP SLV AUR XL STRL (GOWN DISPOSABLE) ×6 IMPLANT
GOWN STRL REIN 2XL LVL4 (GOWN DISPOSABLE) IMPLANT
KIT BASIN OR (CUSTOM PROCEDURE TRAY) ×3 IMPLANT
KIT ROOM TURNOVER OR (KITS) ×3 IMPLANT
MASTERGRAFT MATX STRIP 24CC (Orthopedic Implant) ×3 IMPLANT
MATRIX MASTERGRAFT STRIP 24CC (Orthopedic Implant) ×2 IMPLANT
NEEDLE HYPO 22GX1.5 SAFETY (NEEDLE) ×3 IMPLANT
NS IRRIG 1000ML POUR BTL (IV SOLUTION) ×3 IMPLANT
PACK LAMINECTOMY NEURO (CUSTOM PROCEDURE TRAY) ×3 IMPLANT
PLATE ANTC HORIZ 4.75X41 NS (Trauma) ×2 IMPLANT
ROD 120MM (Rod) ×2 IMPLANT
ROD SPNL CVD 120X4.75X (Rod) ×4 IMPLANT
SCREW MAS 4.5X40 (Screw) ×3 IMPLANT
SCREW MAS 5.0X40 (Screw) ×3 IMPLANT
SCREW MAS 5.5X40 (Screw) ×6 IMPLANT
SCREW MAS 6.5X40 (Screw) ×12 IMPLANT
SCREW SET SOLERA (Screw) ×1 IMPLANT
SCREW SET SOLERA TI (Screw) ×2 IMPLANT
SPONGE GAUZE 4X4 12PLY (GAUZE/BANDAGES/DRESSINGS) ×3 IMPLANT
SPONGE SURGIFOAM ABS GEL 100 (HEMOSTASIS) ×3 IMPLANT
STRIP CLOSURE SKIN 1/2X4 (GAUZE/BANDAGES/DRESSINGS) ×6 IMPLANT
SUT VIC AB 0 CT1 18XCR BRD8 (SUTURE) ×4 IMPLANT
SUT VIC AB 0 CT1 8-18 (SUTURE) ×2
SUT VIC AB 2-0 CT1 18 (SUTURE) ×6 IMPLANT
SUT VIC AB 3-0 SH 8-18 (SUTURE) ×6 IMPLANT
SYR 20ML ECCENTRIC (SYRINGE) ×3 IMPLANT
TAPE CLOTH SURG 4X10 WHT LF (GAUZE/BANDAGES/DRESSINGS) ×3 IMPLANT
TAPE STRIPS DRAPE STRL (GAUZE/BANDAGES/DRESSINGS) ×3 IMPLANT
TOWEL OR 17X24 6PK STRL BLUE (TOWEL DISPOSABLE) ×3 IMPLANT
TOWEL OR 17X26 10 PK STRL BLUE (TOWEL DISPOSABLE) ×6 IMPLANT
TRAY FOLEY CATH 14FRSI W/METER (CATHETERS) IMPLANT
WATER STERILE IRR 1000ML POUR (IV SOLUTION) ×3 IMPLANT

## 2012-04-21 NOTE — OR Nursing (Addendum)
Procedure #1 Dr. Jen Mow Fusion ended 2210.  Procedure #2 Dr. Lazarus Salines - Closed Reduction Nasal Fracture started at 2218.

## 2012-04-21 NOTE — OR Nursing (Signed)
Pt. Positioned supine on unit bed for procedure #2, closed reduction nasal fracture.  Procedure ended at  2227.  [place] splint placed.

## 2012-04-21 NOTE — Progress Notes (Signed)
PT Cancellation and Discharge Note  Treatment cancelled today due to medical issues with patient which prohibited therapy.  Pt on bedrest until evaluated by MD.  Acute PT discontinued by MD.  Will need new order to resume PT.  Ana [name] 04/21/2012, 1:39 PM Nylan Nevel L. Patina Spanier DPT [phone]

## 2012-04-21 NOTE — Transfer of Care (Signed)
Immediate Anesthesia Transfer of Care Note  Patient: Ana [name]  Procedure(s) Performed: Procedure(s) (LRB): POSTERIOR LUMBAR FUSION 4 LEVEL (Bilateral) CLOSED REDUCTION NASAL FRACTURE (N/A)  Patient Location: PACU  Anesthesia Type: General  Level of Consciousness: awake, alert , oriented and patient cooperative  Airway & Oxygen Therapy: Patient Spontanous Breathing and Patient connected to nasal cannula oxygen  Post-op Assessment: Report given to PACU RN, Post -op Vital signs reviewed and stable and Patient moving all extremities X 4  Post vital signs: Reviewed and stable  Complications: No apparent anesthesia complications
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
## 2012-04-21 NOTE — Progress Notes (Signed)
OT Cancellation Note  Treatment cancelled today due to medical issues with patient which prohibited therapy. Pt for surgery tomorrow. Please reorder OT as appropriate. Thanks you.  [facility] Augusta Hilbert, OTR/L  [phone] 8/7/20138/03/2012, 2:41 PM

## 2012-04-21 NOTE — Anesthesia Procedure Notes (Signed)
Procedure Name: Intubation Date/Time: 04/21/2012 7:34 PM Performed by: Rogelia Boga Pre-anesthesia Checklist: Patient identified, Emergency Drugs available, Suction available, Patient being monitored and Timeout performed Patient Re-evaluated:Patient Re-evaluated prior to inductionOxygen Delivery Method: Circle system utilized Preoxygenation: Pre-oxygenation with 100% oxygen Intubation Type: IV induction Ventilation: Mask ventilation without difficulty Laryngoscope Size: Mac and 4 Grade View: Grade I Tube type: Oral Tube size: 7.5 mm Number of attempts: 1 Airway Equipment and Method: Stylet and LTA kit utilized Placement Confirmation: ETT inserted through vocal cords under direct vision,  positive ETCO2 and breath sounds checked- equal and bilateral Secured at: 21 cm Tube secured with: Tape Dental Injury: Teeth and Oropharynx as per pre-operative assessment

## 2012-04-21 NOTE — Progress Notes (Signed)
Patient is back at bedrest after showing some signs of movement with PT and brace.  Will need surgery on her back soon per Dr. Jordan Likes.  This patient has been seen and I agree with the findings and treatment plan.  Ana [name]. Gae Bon, MD, FACS [phone] (pager) [phone] (direct pager) Trauma Surgeon

## 2012-04-21 NOTE — Progress Notes (Signed)
Rehab admissions:  Evaluated for possible admission.  I spoke with mom and sister at bedside.  Mom plans to provide care for patient after a potential inpatient rehab admission.  Noted plans for possible back surgery and nasal surgery.  Noted patient now on bedrest until neuro surgeon evaluates patient.  I have contacted [employer].  Once all tests and procedures are complete, I can admit to inpatient rehab.  I will follow progress for now.  Call me for questions.  #[phone]

## 2012-04-21 NOTE — Brief Op Note (Signed)
04/17/2012 - 04/21/2012  10:21 PM  PATIENT:  Ana [name]  31 y.o. female  PRE-OPERATIVE DIAGNOSIS:  L 3 Fracture  POST-OPERATIVE DIAGNOSIS:  L 3 Fracture  PROCEDURE:  Procedure(s) (LRB): POSTERIOR LUMBAR FUSION 4 LEVEL (Bilateral) CLOSED REDUCTION NASAL FRACTURE (N/A)  SURGEON:  Surgeon(s) and Role:    * Temple Pacini, MD - Primary    * Hewitt Shorts, MD - Assisting    * Flo Shanks, MD - Assisting  PHYSICIAN ASSISTANT:   ASSISTANTS:    ANESTHESIA:   general  EBL:  Total I/O In: 2870 [I.V.:1400; Blood:1470] Out: 1275 [Urine:875; Blood:400]  BLOOD ADMINISTERED:4 units CC PRBC  DRAINS: (Medium large) Hemovact drain(s) in the Epidural space with  Suction Open   LOCAL MEDICATIONS USED:  MARCAINE     SPECIMEN:  No Specimen  DISPOSITION OF SPECIMEN:  N/A  COUNTS:  YES  TOURNIQUET:  * No tourniquets in log *  DICTATION: .Dragon Dictation  PLAN OF CARE: Admit to inpatient   PATIENT DISPOSITION:  PACU - hemodynamically stable.   Delay start of Pharmacological VTE agent (>24hrs) due to surgical blood loss or risk of bleeding: yes

## 2012-04-21 NOTE — Anesthesia Postprocedure Evaluation (Signed)
  Anesthesia Post-op Note  Patient: Pensions consultant  Procedure(s) Performed: Procedure(s) (LRB): POSTERIOR LUMBAR FUSION 4 LEVEL (Bilateral) CLOSED REDUCTION NASAL FRACTURE (N/A)  Patient Location: PACU  Anesthesia Type: General  Level of Consciousness: awake, alert , oriented, sedated and patient cooperative  Airway and Oxygen Therapy: Patient Spontanous Breathing and Patient connected to nasal cannula oxygen  Post-op Pain: mild  Post-op Assessment: Post-op Vital signs reviewed, Patient's Cardiovascular Status Stable, Respiratory Function Stable, Patent Airway, No signs of Nausea or vomiting and Pain level controlled  Post-op Vital Signs: Reviewed and stable  Complications: No apparent anesthesia complications

## 2012-04-21 NOTE — Anesthesia Preprocedure Evaluation (Addendum)
Anesthesia Evaluation  Patient identified by MRN, date of birth, ID band Patient awake    Reviewed: Allergy & Precautions, H&P , NPO status , Patient's Chart, lab work & pertinent test results  Airway Mallampati: II TM Distance: >3 FB Neck ROM: Full  Mouth opening: Limited Mouth Opening  Dental  (+) Dental Advisory Given   Pulmonary          Cardiovascular Rhythm:regular Rate:Normal     Neuro/Psych Anxiety Depression    GI/Hepatic   Endo/Other    Renal/GU      Musculoskeletal   Abdominal   Peds  Hematology   Anesthesia Other Findings   Reproductive/Obstetrics                          Anesthesia Physical Anesthesia Plan  ASA: II  Anesthesia Plan: General   Post-op Pain Management:    Induction: Intravenous  Airway Management Planned: Oral ETT  Additional Equipment: Arterial line  Intra-op Plan:   Post-operative Plan: Extubation in OR  Informed Consent: I have reviewed the patients History and Physical, chart, labs and discussed the procedure including the risks, benefits and alternatives for the proposed anesthesia with the patient or authorized representative who has indicated his/her understanding and acceptance.     Plan Discussed with: Anesthesiologist, Surgeon and CRNA  Anesthesia Plan Comments:        Anesthesia Quick Evaluation

## 2012-04-21 NOTE — Preoperative (Signed)
Beta Blockers   Reason not to administer Beta Blockers:Not Applicable 
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
## 2012-04-21 NOTE — Progress Notes (Signed)
Foley catheter  place per protocol,3rd time patient has over 300 ml in the bladder,16 french foley put in,pt tolerates well.

## 2012-04-21 NOTE — Progress Notes (Signed)
7 AUG 13  Post trauma day 4  Pain in multiple sites still.  No pain in eyes, no pain with ROM.  Mother feels nose looks crooked.    Still some nasal swelling.  Nasal bones feel displaced LEFT, possible depression of RIGHT nasal bone.  Imp:  Orbital b/o fx, no repair needed.  Nasal fx, displaced.  Plan:  I would do a closed reduction nasal fx in the 7-10 day range.  However, if she needs surgery for her lumbar spine, I could probably tag along under anesthesia and perform this whenever.  Still needs Ophth eval.  Flo Shanks MD

## 2012-04-21 NOTE — Op Note (Signed)
04/21/2012  10:29 PM    Duffy Rhody, [place] Nakayama  [id number]   Pre-Op Dx:  Displaced Closed Nasal Fracture  Post-op Dx: same  Proc: Closed Reduction Nasal Fracture with Stabilization   Surg:  Flo Shanks T MD  Anes:  GOT  EBL:  none  Comp:  none  Findings:  Depressed RIGHT nasal bone with LEFTWARD displace bony dorsum  Procedure: With the patient on the bed in a slightly head elevated position, following posterior spinal fusion per Dr. Dutch Quint, the nose was inspected with the findings as described above. She had received preoperative Afrin spray several hours ago. There was some swelling of the midface, lips, and tongue consistent with prone positioning for the spinal fusion. The blunt fracture elevator was measured to the level of the medial canthus to avoid trauma to the cribriform plate.  It was placed under the dorsum of the nose on the right side and with anterior and right lateral pressure, the nasal bone was elevated and the dorsum was displaced into the midline. There was slight mobility of the nasal bones on both sides read there was no significant bleeding.  The external nose was cleaned with alcohol and then painted with skin prep.  1/2 inch Steri-Strips were applied in the standard fashion. A petite [place] splint was configured. With the nose in adequate position, this was placed down on top of the Steri-Strips and compressed slightly for support. A good straight configuration of the dorsum was felt to have been accomplished. Small amounts of blood were suctioned from the internal nose on both sides. At this point the procedure was completed.  Patient was returned to anesthesia, awakened, extubated, and and transferred to [facility] neurosurgical ICU.  Dispo:   Per Dr. Dutch Quint  Plan:  Ice x 24 hrs.  Elevation.  Remove dorsal splint at 10 days  Flo Shanks T MD

## 2012-04-22 ENCOUNTER — Inpatient Hospital Stay (HOSPITAL_COMMUNITY): Payer: 59

## 2012-04-22 ENCOUNTER — Encounter (HOSPITAL_COMMUNITY): Payer: Self-pay | Admitting: *Deleted

## 2012-04-22 ENCOUNTER — Encounter (HOSPITAL_COMMUNITY): Payer: Self-pay | Admitting: Certified Registered Nurse Anesthetist

## 2012-04-22 DIAGNOSIS — Z09 Encounter for follow-up examination after completed treatment for conditions other than malignant neoplasm: Secondary | ICD-10-CM

## 2012-04-22 DIAGNOSIS — R0902 Hypoxemia: Secondary | ICD-10-CM

## 2012-04-22 LAB — POCT I-STAT 7, (LYTES, BLD GAS, ICA,H+H)
O2 Saturation: 99 %
TCO2: 34 mmol/L (ref 0–100)
pCO2 arterial: 45.5 mmHg — ABNORMAL HIGH (ref 35.0–45.0)
pO2, Arterial: 111 mmHg — ABNORMAL HIGH (ref 80.0–100.0)

## 2012-04-22 LAB — BASIC METABOLIC PANEL
BUN: 9 mg/dL (ref 6–23)
CO2: 29 mEq/L (ref 19–32)
CO2: 29 mEq/L (ref 19–32)
Calcium: 8.7 mg/dL (ref 8.4–10.5)
Chloride: 103 mEq/L (ref 96–112)
Creatinine, Ser: 0.53 mg/dL (ref 0.50–1.10)
GFR calc non Af Amer: >90 mL/min (ref >90)
GFR calc non Af Amer: >90 mL/min (ref >90)
Glucose, Bld: 120 mg/dL — ABNORMAL HIGH (ref 70–99)
Glucose, Bld: 129 mg/dL — ABNORMAL HIGH (ref 70–99)
Potassium: 3.7 mEq/L (ref 3.5–5.1)
Sodium: 138 mEq/L (ref 135–145)

## 2012-04-22 LAB — CBC
MCHC: 35.7 g/dL (ref 30.0–36.0)
Platelets: 160 10*3/uL (ref 150–400)
RDW: 13.6 % (ref 11.5–15.5)
WBC: 9.5 10*3/uL (ref 4.0–10.5)

## 2012-04-22 LAB — BLOOD GAS, ARTERIAL
Acid-Base Excess: 3.6 mmol/L — ABNORMAL HIGH (ref 0.0–2.0)
Bicarbonate: 28.2 mEq/L — ABNORMAL HIGH (ref 20.0–24.0)
O2 Content: 4 L/min
O2 Saturation: 89 %
pO2, Arterial: 56.1 mmHg — ABNORMAL LOW (ref 80.0–100.0)

## 2012-04-22 MED ORDER — BISACODYL 10 MG RE SUPP
10.0000 mg | Freq: Every day | RECTAL | Status: DC | PRN
  Administered 2012-04-23: 10 mg via RECTAL
  Filled 2012-04-22: qty 1

## 2012-04-22 MED ORDER — HYDROMORPHONE 0.3 MG/ML IV SOLN
INTRAVENOUS | Status: DC
  Administered 2012-04-22: 0.3 mg via INTRAVENOUS
  Administered 2012-04-22 – 2012-04-23 (×2): via INTRAVENOUS
  Administered 2012-04-23: 5.5 mg via INTRAVENOUS
  Administered 2012-04-23: 22:00:00 via INTRAVENOUS
  Administered 2012-04-23: 3.59 mg via INTRAVENOUS
  Administered 2012-04-23: 4.71 mg via INTRAVENOUS
  Administered 2012-04-23: 14:00:00 via INTRAVENOUS
  Administered 2012-04-23: 8.06 mg via INTRAVENOUS
  Administered 2012-04-23: 10:00:00 via INTRAVENOUS
  Administered 2012-04-24 (×2): 0.9 mg via INTRAVENOUS
  Administered 2012-04-24: 0.6 mg via INTRAVENOUS
  Administered 2012-04-24: 0.345 mg via INTRAVENOUS
  Administered 2012-04-24: 0.9 mg via INTRAVENOUS
  Administered 2012-04-25 (×2): 0.6 mg via INTRAVENOUS
  Filled 2012-04-22 (×6): qty 25

## 2012-04-22 MED ORDER — MENTHOL 3 MG MT LOZG: 1.0000 | LOZENGE | OROMUCOSAL | Status: DC | PRN

## 2012-04-22 MED ORDER — IPRATROPIUM-ALBUTEROL 18-103 MCG/ACT IN AERO
2.0000 | INHALATION_SPRAY | RESPIRATORY_TRACT | Status: DC
  Administered 2012-04-22 – 2012-04-24 (×11): 2 via RESPIRATORY_TRACT
  Filled 2012-04-22: qty 14.7

## 2012-04-22 MED ORDER — ENOXAPARIN SODIUM 40 MG/0.4ML ~~LOC~~ SOLN
40.0000 mg | SUBCUTANEOUS | Status: DC
  Administered 2012-04-22 – 2012-04-27 (×6): 40 mg via SUBCUTANEOUS
  Filled 2012-04-22 (×6): qty 0.4

## 2012-04-22 MED ORDER — SENNA 8.6 MG PO TABS: 1.0000 | ORAL_TABLET | Freq: Two times a day (BID) | ORAL | Status: DC

## 2012-04-22 MED ORDER — PANTOPRAZOLE SODIUM 40 MG IV SOLR
40.0000 mg | Freq: Every day | INTRAVENOUS | Status: DC
  Administered 2012-04-22 – 2012-04-25 (×5): 40 mg via INTRAVENOUS
  Filled 2012-04-22 (×8): qty 40

## 2012-04-22 MED ORDER — FLEET ENEMA 7-19 GM/118ML RE ENEM
1.0000 | ENEMA | Freq: Once | RECTAL | Status: AC
  Administered 2012-04-22: 1 via RECTAL
  Filled 2012-04-22: qty 1

## 2012-04-22 MED ORDER — IOHEXOL 350 MG/ML SOLN
80.0000 mL | Freq: Once | INTRAVENOUS | Status: AC | PRN
  Administered 2012-04-22: 80 mL via INTRAVENOUS

## 2012-04-22 MED ORDER — ONDANSETRON HCL 4 MG/2ML IJ SOLN: 4.0000 mg | INTRAMUSCULAR | Status: DC | PRN

## 2012-04-22 MED ORDER — DIAZEPAM 5 MG PO TABS: 5.0000 mg | ORAL_TABLET | Freq: Four times a day (QID) | ORAL | Status: DC | PRN

## 2012-04-22 MED ORDER — OXYMETAZOLINE HCL 0.05 % NA SOLN
2.0000 | NASAL | Status: AC
  Filled 2012-04-22: qty 15

## 2012-04-22 MED ORDER — FLEET ENEMA 7-19 GM/118ML RE ENEM
1.0000 | ENEMA | Freq: Once | RECTAL | Status: AC | PRN
Start: 2012-04-22 — End: 2012-04-22

## 2012-04-22 MED ORDER — ACETAMINOPHEN 325 MG PO TABS: 650.0000 mg | ORAL_TABLET | ORAL | Status: DC | PRN

## 2012-04-22 MED ORDER — PHENOL 1.4 % MT LIQD: 1.0000 | OROMUCOSAL | Status: DC | PRN

## 2012-04-22 MED ORDER — ALUM & MAG HYDROXIDE-SIMETH 200-200-20 MG/5ML PO SUSP: 30.0000 mL | Freq: Four times a day (QID) | ORAL | Status: DC | PRN

## 2012-04-22 MED ORDER — POLYVINYL ALCOHOL 1.4 % OP SOLN
1.0000 [drp] | OPHTHALMIC | Status: DC | PRN
  Filled 2012-04-22: qty 15

## 2012-04-22 MED ORDER — SODIUM CHLORIDE 0.9 % IJ SOLN: 3.0000 mL | INTRAMUSCULAR | Status: DC | PRN

## 2012-04-22 MED ORDER — CEFAZOLIN SODIUM-DEXTROSE 2-3 GM-% IV SOLR
2.0000 g | INTRAVENOUS | Status: DC
  Filled 2012-04-22: qty 50

## 2012-04-22 MED ORDER — CEFAZOLIN SODIUM 1-5 GM-% IV SOLN
1.0000 g | Freq: Three times a day (TID) | INTRAVENOUS | Status: AC
  Administered 2012-04-22 (×2): 1 g via INTRAVENOUS
  Filled 2012-04-22 (×2): qty 50

## 2012-04-22 MED ORDER — POLYETHYLENE GLYCOL 3350 17 G PO PACK
17.0000 g | PACK | Freq: Every day | ORAL | Status: DC | PRN
  Administered 2012-04-23: 17 g via ORAL
  Filled 2012-04-22: qty 1

## 2012-04-22 MED ORDER — SODIUM CHLORIDE 0.9 % IV SOLN: 250.0000 mL | INTRAVENOUS | Status: DC

## 2012-04-22 MED ORDER — ACETAMINOPHEN 650 MG RE SUPP: 650.0000 mg | RECTAL | Status: DC | PRN

## 2012-04-22 MED ORDER — POTASSIUM CHLORIDE IN NACL 20-0.9 MEQ/L-% IV SOLN
INTRAVENOUS | Status: DC
  Administered 2012-04-22: 1000 mL via INTRAVENOUS
  Administered 2012-04-22 – 2012-04-26 (×8): via INTRAVENOUS
  Filled 2012-04-22 (×18): qty 1000

## 2012-04-22 MED ORDER — SODIUM CHLORIDE 0.9 % IJ SOLN
3.0000 mL | Freq: Two times a day (BID) | INTRAMUSCULAR | Status: DC
  Administered 2012-04-22 – 2012-04-25 (×5): 3 mL via INTRAVENOUS

## 2012-04-22 MED FILL — Cefazolin Sodium for IV Soln 2 GM and Dextrose 3% (50 ML): INTRAVENOUS | Qty: 50 | Status: AC

## 2012-04-22 NOTE — Progress Notes (Signed)
PT Cancellation Note     Treatment cancelled today due to pt in respiratory distress and painful-- hold today per RN  04/22/2012  Duncan Bing, PT [phone] (pager)

## 2012-04-22 NOTE — Progress Notes (Signed)
Called about abdominal distension which she says has been increasing gradually over the last few days.  It is becoming more uncomfortable.   Wt Readings from Last 3 Encounters:  04/17/12 161 lb 2.5 oz (73.1 kg)  04/17/12 161 lb 2.5 oz (73.1 kg)  04/17/12 161 lb 2.5 oz (73.1 kg)   Temp Readings from Last 3 Encounters:  04/22/12 98.8 F (37.1 C) Oral  04/22/12 98.8 F (37.1 C) Oral  04/22/12 98.8 F (37.1 C) Oral   BP Readings from Last 3 Encounters:  04/22/12 147/84  04/22/12 147/84  04/22/12 147/84   Pulse Readings from Last 3 Encounters:  04/22/12 115  04/22/12 115  04/22/12 115  NAD, nontoxic appearing Her abdomen is soft, but distended with tympany.  No peritoneal signs and no significant tenderness on exam, she does have bowel sounds  She has bowel sounds but no flatus or BM.  I doubt that this is bowel injury at this point.  Likely ileus or due to narcotics. We will check some abdominal films and make her NPO and increase fluids.  NG not a good option given facial fractures.  Consider enema.
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
## 2012-04-22 NOTE — Progress Notes (Signed)
OT Cancel Note  I agree with the following treatment note after reviewing documentation.   Harrel Carina [place]   OTR/L Pager: [phone] Office: [phone] .

## 2012-04-22 NOTE — Progress Notes (Signed)
Postop day 1 from lumbar fusion. Patient awake and aware. States that her pain is well controlled. She denies any radicular pain. She's has no complaints of numbness paresthesias or dysesthesias.  Currently afebrile. Mildly tachycardic. Blood pressure normal. Urine output good. Oxygenation marginal. Appears to have more normal motor strength her left lower extremity. She can wiggle her toes on the right side. Her dressing is dry. Drain output reasonably low.  Hematocrit 30. PT slightly elevated otherwise labs okay.  Doing reasonably well following surgery last night. Patient okay to be mobilized in her brace for my standpoint. She may now don and doff her brace while sitting at the bedside. May transfer out of ICU when cleared by trauma surgery.

## 2012-04-22 NOTE — Progress Notes (Signed)
UR completed 

## 2012-04-22 NOTE — Progress Notes (Signed)
VASCULAR LAB PRELIMINARY  PRELIMINARY  PRELIMINARY  PRELIMINARY  Bilateral lower extremity venous duplex  completed.    Preliminary report:  Bilateral:  No obvious evidence of DVT, superficial thrombosis, or Baker's Cyst.  Right leg not imaged from mid thigh to ankle secondary to cast.   Stone Spirito, RVT 04/22/2012, 3:33 PM

## 2012-04-22 NOTE — Progress Notes (Signed)
Called Trauma MD on call to report patient discomfort and increasing abdominal distention.  MD will evaluate record and come assess patient. Ana [name] C
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
## 2012-04-22 NOTE — Progress Notes (Signed)
Patients O2 sats are staying in the mid to upper 80's. Dr. Lindie Spruce notified. He stated those levels were fine for now. No new orders given. Encouraged pt to use IS and cough and deep breath. Will continue to monitor.

## 2012-04-23 ENCOUNTER — Encounter (HOSPITAL_COMMUNITY): Payer: Self-pay | Admitting: Neurosurgery

## 2012-04-23 LAB — CBC WITH DIFFERENTIAL/PLATELET
Basophils Absolute: 0 10*3/uL (ref 0.0–0.1)
HCT: 31.7 % — ABNORMAL LOW (ref 36.0–46.0)
Hemoglobin: 11 g/dL — ABNORMAL LOW (ref 12.0–15.0)
Lymphocytes Relative: 8 % — ABNORMAL LOW (ref 12–46)
Monocytes Absolute: 0.9 10*3/uL (ref 0.1–1.0)
Monocytes Relative: 7 % (ref 3–12)
Neutro Abs: 9.9 10*3/uL — ABNORMAL HIGH (ref 1.7–7.7)
RBC: 3.74 MIL/uL — ABNORMAL LOW (ref 3.87–5.11)
WBC: 12.1 10*3/uL — ABNORMAL HIGH (ref 4.0–10.5)

## 2012-04-23 LAB — URINALYSIS, ROUTINE W REFLEX MICROSCOPIC
Glucose, UA: NEGATIVE mg/dL
Ketones, ur: 15 mg/dL — AB
pH: 8 (ref 5.0–8.0)

## 2012-04-23 LAB — TYPE AND SCREEN
ABO/RH(D): A POS
Antibody Screen: NEGATIVE
Unit division: 0
Unit division: 0
Unit division: 0
Unit division: 0

## 2012-04-23 LAB — URINE MICROSCOPIC-ADD ON

## 2012-04-23 LAB — BASIC METABOLIC PANEL
BUN: 5 mg/dL — ABNORMAL LOW (ref 6–23)
CO2: 27 mEq/L (ref 19–32)
Chloride: 102 mEq/L (ref 96–112)
Creatinine, Ser: 0.43 mg/dL — ABNORMAL LOW (ref 0.50–1.10)

## 2012-04-23 MED ORDER — ACETAMINOPHEN 325 MG PO TABS: 650.0000 mg | ORAL_TABLET | ORAL | Status: DC | PRN

## 2012-04-23 MED ORDER — ACETAMINOPHEN 650 MG RE SUPP: 650.0000 mg | RECTAL | Status: DC | PRN

## 2012-04-23 MED ORDER — WHITE PETROLATUM GEL
Status: AC
  Filled 2012-04-23: qty 5

## 2012-04-23 MED ORDER — METHOCARBAMOL 500 MG PO TABS
1000.0000 mg | ORAL_TABLET | Freq: Four times a day (QID) | ORAL | Status: DC
  Administered 2012-04-23 – 2012-04-27 (×17): 1000 mg via ORAL
  Filled 2012-04-23 (×23): qty 2

## 2012-04-23 MED ORDER — WHITE PETROLATUM GEL
Status: AC
  Administered 2012-04-23: 14:00:00
  Filled 2012-04-23: qty 5

## 2012-04-23 MED ORDER — METOCLOPRAMIDE HCL 5 MG/ML IJ SOLN
10.0000 mg | Freq: Four times a day (QID) | INTRAMUSCULAR | Status: DC
  Administered 2012-04-23 – 2012-04-27 (×15): 10 mg via INTRAVENOUS
  Filled 2012-04-23 (×23): qty 2

## 2012-04-23 MED FILL — Sodium Chloride IV Soln 0.9%: INTRAVENOUS | Qty: 2000 | Status: AC

## 2012-04-23 MED FILL — Heparin Sodium (Porcine) Inj 1000 Unit/ML: INTRAMUSCULAR | Qty: 30 | Status: AC

## 2012-04-23 NOTE — Progress Notes (Signed)
Ileus seems to be slowly resolving although not able to feed yet..  Will hold off on NGT for not, but clarify with [facility] if it wound be okay to pass one.  This patient has been seen and I agree with the findings and treatment plan.  Marta Lamas. Gae Bon, MD, FACS [phone] (pager) [phone] (direct pager) Trauma Surgeon

## 2012-04-23 NOTE — Progress Notes (Signed)
OT NOTE  OT with splint order pending for Lt elbow. OT unable to complete evaluation or splint on 04/22/12 due to respiratory issues. OT to attempt to complete splint fabrication 04/23/12. Pt transferring from 3100 to 5N presently. Family and RN staff informed of OT splint fabrication pending.   Ana [name]   OTR/L Pager: [phone] Office: [phone] .

## 2012-04-23 NOTE — Progress Notes (Signed)
Occupational Therapy Evaluation Patient Details Name: Ana [name] MRN: [id number] DOB: 11-Oct-1980 Today's Date: 04/23/2012 Time: 4401-0272 OT Time Calculation (min): 207 min  OT Assessment / Plan / Recommendation             Follow Up Recommendations  Inpatient Rehab    Barriers to Discharge      Equipment Recommendations  Defer to next venue    Recommendations for Other Services Rehab consult  Frequency  Min 3X/week    Precautions / Restrictions Precautions Precaution Comments: no supination pronation, elbow flexion to 90 degrees Required Braces or Orthoses: Other Brace/Splint Other Brace/Splint: elbow splint Restrictions [name] Weight Bearing: Partial weight bearing [name] Partial Weight Bearing Percentage or Pounds: 50%   Pertinent Vitals/Pain Pt pushing PCA as needed during session    ADL       OT Treatment Interventions: Splinting;Self-care/ADL training;Patient/family education   OT Goals Acute Rehab OT Goals OT Goal Formulation: With patient/family Time For Goal Achievement: 05/07/12 Potential to Achieve Goals: Good Arm Goals Pt Will Perform AROM: Left upper extremity;Other (comment) (fingers ONLY. (flexion/extension / opposition))) Arm Goal: AROM - Progress: Goal set today Miscellaneous OT Goals Miscellaneous OT Goal #1: Pt will tolerate splint for entire day without sign or symptoms of skin breakdown. OT Goal: Miscellaneous Goal #1 - Progress: Goal set today Miscellaneous OT Goal #2: Pt will verbalize and demonstrate edema management with [name] elevated and supported on pillow Supervision OT Goal: Miscellaneous Goal #2 - Progress: Goal set today  Visit Information  Last OT Received On: 04/23/12 Assistance Needed: +2    Subjective Data  Subjective: "its my son's birthday"   Prior Functioning  Vision/Perception  Home Living Additional Comments: OT fabricated : elbow splint with elbow flexion to ~75-80 degrees and wrist in neutral position. Pt unable to  tolerate 90 degrees elbow flexion.Pt should remain in splint at all times. OT to change dressing Saturday 04/24/12 and check splint for signs/symptoms of skin breakdown to ensure proper fit. Pt with drainage noted at all incision sites. RN Cary observed incisions at dressing change. Pt with xeroform applied over incision with 4x4 gauze and wrapped in sof form gauze. Pt with elbow splint applied. Pt, mother, step mother and RN educated on no supination/ pronation, neutral position of wrist, OT to complete all dressing changes, splint to be on at all times no exceptions. Hand out placed in patients chart and above HOB on splint care. OT checked patient 2 hours post splint fabrication, pt with no issues.                                  GO     Harrel Carina [facility] 04/23/2012, 7:51 PM Pager: [phone]

## 2012-04-23 NOTE — Progress Notes (Signed)
Unit pulseox 

## 2012-04-23 NOTE — Progress Notes (Signed)
Postop day 2. Patient's pain recently well-controlled. No new neurologic symptoms main problem seems to be better for her rather pronounced ileus.  Overall doing well from her spinal fusion. Mobilize as tolerated with her brace.

## 2012-04-23 NOTE — Progress Notes (Signed)
PT Cancellation Note     Evaluation cancelled today due to pt being splinted by OT and pt unavailable. 04/23/2012  [place] Bing, PT [phone] (pager)

## 2012-04-23 NOTE — Progress Notes (Signed)
Rehab admissions - I met with stepmom and spoke with dad.  I gave brochures about rehab to them.  I am planning for admission to acute inpatient rehab next week when she is medically stable.  Call me for questions.  #[phone]

## 2012-04-23 NOTE — Progress Notes (Signed)
OT SPLINT NOTE  OT fabricated : elbow splint with elbow flexion to ~75-80 degrees and wrist in neutral position. Pt unable to tolerate 90 degrees elbow flexion.Pt should remain in splint at all times. OT to change dressing Saturday 04/24/12 and check splint for signs/symptoms of skin breakdown to ensure proper fit. Pt with drainage noted at all incision sites. RN Cary observed incisions at dressing change. Pt with xeroform applied over incision with 4x4 gauze and wrapped in sof form gauze. Pt with elbow splint applied. Pt, mother, step mother and RN educated on no supination/ pronation, neutral position of wrist, OT to complete all dressing changes, splint to be on at all times no exceptions. Hand out placed in patients chart and above HOB on splint care. OT checked patient 2 hours post splint fabrication, pt with no issues.   Harrel Carina Ambler   OTR/L Pager: [phone] Office: [phone] .
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
## 2012-04-24 DIAGNOSIS — K560 Paralytic ileus: Secondary | ICD-10-CM

## 2012-04-24 LAB — CBC
HCT: 28.8 % — ABNORMAL LOW (ref 36.0–46.0)
Hemoglobin: 10 g/dL — ABNORMAL LOW (ref 12.0–15.0)
MCV: 86.2 fL (ref 78.0–100.0)
RBC: 3.34 MIL/uL — ABNORMAL LOW (ref 3.87–5.11)
WBC: 9.9 10*3/uL (ref 4.0–10.5)

## 2012-04-24 MED ORDER — IPRATROPIUM-ALBUTEROL 18-103 MCG/ACT IN AERO
2.0000 | INHALATION_SPRAY | Freq: Two times a day (BID) | RESPIRATORY_TRACT | Status: DC
  Administered 2012-04-24 – 2012-04-27 (×4): 2 via RESPIRATORY_TRACT
  Filled 2012-04-24 (×2): qty 14.7

## 2012-04-24 NOTE — Progress Notes (Signed)
<  principal problem not specified>  Subjective: Not much abdominal pain, improved. Still feels distended, but also feels better. Had a small BM  Objective: Vital signs in last 24 hours: Temp:  [98.4 F (36.9 C)-98.8 F (37.1 C)] 98.5 F (36.9 C) (08/10 0500) Pulse Rate:  [110-117] 110  (08/10 0500) Resp:  [16-20] 16  (08/10 0800) BP: (126-130)/(80-85) 130/81 mmHg (08/10 0500) SpO2:  [92 %-100 %] 100 % (08/10 0957) Last BM Date: 04/23/12  Intake/Output from previous day: 08/09 0701 - 08/10 0700 In: 500 [I.V.:500] Out: 3975 [Urine:3975] Intake/Output this shift: Total I/O In: -  Out: 1200 [Urine:1200]  General appearance: alert and no distress GI: Abd is slightly distended, but not tight, soft, not tender, BS +  Lab Results:  Results for orders placed during the hospital encounter of 04/17/12 (from the past 24 hour(s))  CBC     Status: Abnormal   Collection Time   04/24/12  6:00 AM      Component Value Range   WBC 9.9  4.0 - 10.5 K/uL   RBC 3.34 (*) 3.87 - 5.11 MIL/uL   Hemoglobin 10.0 (*) 12.0 - 15.0 g/dL   HCT 82.9 (*) 56.2 - 13.0 %   MCV 86.2  78.0 - 100.0 fL   MCH 29.9  26.0 - 34.0 pg   MCHC 34.7  30.0 - 36.0 g/dL   RDW 86.5  78.4 - 69.6 %   Platelets 265  150 - 400 K/uL     Studies/Results Radiology     MEDS, Scheduled    . albuterol-ipratropium  2 puff Inhalation BID  . antiseptic oral rinse  15 mL Mouth Rinse BID  . bisacodyl  10 mg Rectal Once  . docusate sodium  200 mg Oral BID  . enoxaparin (LOVENOX) injection  40 mg Subcutaneous Q24H  . HYDROmorphone PCA 0.3 mg/mL   Intravenous Q4H  . methocarbamol  1,000 mg Oral QID  . metoCLOPramide (REGLAN) injection  10 mg Intravenous Q6H  . multivitamin with minerals  1 tablet Oral Daily  . pantoprazole (PROTONIX) IV  40 mg Intravenous QHS  . polyethylene glycol  17 g Oral Daily  . senna  1 tablet Oral BID  . sodium chloride  3 mL Intravenous Q12H  . traMADol  100 mg Oral Q6H  . white petrolatum        . white petrolatum      . DISCONTD: albuterol-ipratropium  2 puff Inhalation Q4H  . DISCONTD: magnesium citrate  1 Bottle Oral BID     Assessment: <principal problem not specified> Ileus improving  Plan: Continue current management. May be able to start more PO tomorrow  LOS: 7 days    Currie Paris, MD, [facility], [place] [phone]   04/24/2012 11:41 AM
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
## 2012-04-24 NOTE — Progress Notes (Signed)
Occupational Therapy Treatment Patient Details Name: Ana [name] MRN: [id number] DOB: 1980/09/16 Today's Date: 04/24/2012 Time: 4098-1191 OT Time Calculation (min): 36 min  OT Assessment / Plan / Recommendation Comments on Treatment Session Pt progressing well and remains strong CIR candidate. Pt reports no pain due to splint but discomfort in [name] near wrist. Pt OOB to chair this session.    Follow Up Recommendations  Inpatient Rehab    Barriers to Discharge       Equipment Recommendations  Defer to next venue    Recommendations for Other Services Rehab consult  Frequency Min 3X/week   Plan Discharge plan remains appropriate    Precautions / Restrictions Precautions Precautions: Back;Fall Precaution Booklet Issued: No Precaution Comments: no supination pronation, elbow flexion to 90 degrees Required Braces or Orthoses: Other Brace/Splint (Left UE elbow splint) Spinal Brace: Thoracolumbosacral orthotic;Applied in sitting position (Pt and mother prefer supine application.) Other Brace/Splint: elbow splint Restrictions Weight Bearing Restrictions: Yes [name] Weight Bearing: Non weight bearing [name] Weight Bearing: Partial weight bearing [name] Partial Weight Bearing Percentage or Pounds: 50%   Pertinent Vitals/Pain Discomfort at Rt Ue wrist not rated    ADL  Eating/Feeding: Performed;Moderate assistance Where Assessed - Eating/Feeding: Edge of bed Upper Body Dressing: Performed;+2 Total assistance Where Assessed - Upper Body Dressing: Supine, head of bed flat (don TLSO, leaving brace fastened on one side) Lower Body Dressing: Performed;+1 Total assistance Where Assessed - Lower Body Dressing: Supine, head of bed up (don socks) Toilet Transfer: Simulated;+2 Total assistance Toilet Transfer: Patient Percentage: 70% Toilet Transfer Method: Stand pivot Acupuncturist: Raised toilet seat with arms (or 3-in-1 over toilet) Equipment Used: Back brace;Gait belt;Other  (comment) (blue shoulder sling) ADL Comments: Pt completed bed mobility from supine <>sit with mother and boyfriend present. pt and mother able to direct care. Pt required prolonged sitting eob due to dizziness initially. Pt completed sit<>stand and returning to sitting EOB. Pt agreeable to OOB to chair. Pt completed transfer maintaining PWB 50% on Rt LE. Pt Pt positioned in chair and educated on OT returning this PM to redress [name] wound for elbow splint check        OT Goals Acute Rehab OT Goals OT Goal Formulation: With patient/family Time For Goal Achievement: 05/07/12 Potential to Achieve Goals: Good ADL Goals Pt Will Perform Grooming: Supported;Sitting, chair;with min assist ADL Goal: Grooming - Progress: Goal set today Pt Will Transfer to Toilet: with max assist;3-in-1;Stand pivot transfer ADL Goal: Toilet Transfer - Progress: Goal set today Arm Goals Pt Will Perform AROM: Left upper extremity;Other (comment) (fingers ONLY. (flexion/extension / opposition) Miscellaneous OT Goals Miscellaneous OT Goal #1: Pt will tolerate splint for entire day without sign or symptoms of skin breakdown. OT Goal: Miscellaneous Goal #1 - Progress: Progressing toward goals Miscellaneous OT Goal #2: Pt will verbalize and demonstrate edema management with [name] elevated and supported on pillow Supervision  Visit Information  Last OT Received On: 04/24/12 Assistance Needed: +2 PT/OT Co-Evaluation/Treatment: Yes    Subjective Data      Prior Functioning  Home Living Lives With: Family Available Help at Discharge: Family;Available 24 hours/day Type of Home: Mobile home Home Access: Stairs to enter Entrance Stairs-Number of Steps: 8 steps front, 5-6 steps back entry. Home Layout: One level Home Adaptive Equipment: None Prior Function Level of Independence: Independent Able to Take Stairs?: Yes Driving: Yes Vocation: Full time employment Communication Communication: No difficulties Dominant  Hand: Right    Cognition  Overall Cognitive Status: Appears  within functional limits for tasks assessed/performed Arousal/Alertness: Awake/alert Orientation Level: Oriented X4 / Intact Behavior During Session: [facility] for tasks performed    Mobility Bed Mobility Bed Mobility: Rolling Right;Right Sidelying to Sit Rolling Right: 1: +2 Total assist;With rail Rolling Right: Patient Percentage: 50% Right Sidelying to Sit: 1: +2 Total assist;HOB flat Right Sidelying to Sit: Patient Percentage: 40% Details for Bed Mobility Assistance: Assist to facilitate rotation of trunk while supporting left UE to roll right.  Assist also at shoulders to translate trunk to midline after assist bilateral LEs off EOB.  Pt able to push up with right UE to achieve midline.  Cues for sequence. Transfers Sit to Stand: 1: +2 Total assist;With upper extremity assist;From bed Sit to Stand: Patient Percentage: 70% Stand to Sit: 1: +2 Total assist;With upper extremity assist;To bed;To chair/3-in-1 Stand to Sit: Patient Percentage: 70% Details for Transfer Assistance: Assist for balance with patient able to translate trunk anterior over BOS while maitain PWBing right LE and NWBing left UE.  Pt using right UE to pull up into stance.  Cues for safest sequence.  Assist to rotate pelvis to chair.   Exercises    Balance Balance Balance Assessed: No  End of Session OT - End of Session Activity Tolerance: Patient tolerated treatment well Patient left: in chair;with call bell/phone within reach;Other (comment) Nurse Communication: Mobility status;Precautions  GO     Harrel Carina [facility] 04/24/2012, 1:50 PM Pager: [phone]

## 2012-04-24 NOTE — Progress Notes (Signed)
Doing well. C/o appropriate incisional soreness. N oleg  Numbness, tingling, weakness  Not OOB yet  Temp:  [98.4 F (36.9 C)-98.8 F (37.1 C)] 98.5 F (36.9 C) (08/10 0500) Pulse Rate:  [110-134] 110  (08/10 0500) Resp:  [17-24] 18  (08/10 0500) BP: (126-143)/(60-89) 130/81 mmHg (08/10 0500) SpO2:  [90 %-99 %] 98 % (08/10 0500) Good strength and sensation LE (Right leg in cast) Incision CDI  Plan: mobilize with brace as other injuries allow

## 2012-04-25 ENCOUNTER — Inpatient Hospital Stay (HOSPITAL_COMMUNITY): Payer: 59

## 2012-04-25 LAB — URINE CULTURE: Colony Count: >=100000

## 2012-04-25 MED ORDER — HYDROMORPHONE 0.3 MG/ML IV SOLN
INTRAVENOUS | Status: DC
  Administered 2012-04-25: 19:00:00 via INTRAVENOUS
  Administered 2012-04-26: 0.6 mg via INTRAVENOUS
  Filled 2012-04-25: qty 25

## 2012-04-25 NOTE — Progress Notes (Signed)
OT Cancellation Note  Treatment cancelled today due to patient receiving procedure or test (xray). Will return this PM. Thanks.  04/25/2012 Cipriano Mile OTR/L Pager [phone] Office [phone]

## 2012-04-25 NOTE — Progress Notes (Signed)
Occupational Therapy Treatment Patient Details Name: Ana [name] MRN: [id number] DOB: 06-Nov-1980 Today's Date: 04/25/2012 Time: 7829-5621 OT Time Calculation (min): 12 min  OT Assessment / Plan / Recommendation Comments on Treatment Session Pt demonstrating correct positioning of [name] for edema control.    Follow Up Recommendations  Inpatient Rehab    Barriers to Discharge       Equipment Recommendations  Defer to next venue    Recommendations for Other Services Rehab consult  Frequency Min 3X/week   Plan Discharge plan remains appropriate    Precautions / Restrictions Precautions Precautions: Back;Fall Precaution Booklet Issued: No Precaution Comments: no supination pronation, elbow flexion to 90 degrees Required Braces or Orthoses: Other Brace/Splint Spinal Brace: Thoracolumbosacral orthotic;Applied in sitting position Other Brace/Splint: elbow splint Restrictions Weight Bearing Restrictions: Yes [name] Weight Bearing: Non weight bearing [name] Weight Bearing: Partial weight bearing [name] Partial Weight Bearing Percentage or Pounds: 50%   Pertinent Vitals/Pain See vitals    ADL  ADL Comments: Assessed [name] splint to ensure proper positioning and skin integrity.  No redness noted.  Pt reports she is experiencing pain in her elbow,but it feels like "arm pain" rather than pressure from splint.  Upon arrival, pt already with [name] elevated on pillow as taught.  10 rep x 2 sets of digit flexion/extension.    OT Diagnosis:    OT Problem List:   OT Treatment Interventions:     OT Goals Arm Goals Pt Will Perform AROM: Left upper extremity;Other (comment) Arm Goal: AROM - Progress: Progressing toward goal Miscellaneous OT Goals Miscellaneous OT Goal #1: Pt will tolerate splint for entire day without sign or symptoms of skin breakdown. OT Goal: Miscellaneous Goal #1 - Progress: Progressing toward goals Miscellaneous OT Goal #2: Pt will verbalize and demonstrate edema management with  [name] elevated and supported on pillow Supervision OT Goal: Miscellaneous Goal #2 - Progress: Progressing toward goals  Visit Information  Last OT Received On: 04/25/12    Subjective Data      Prior Functioning       Cognition  Overall Cognitive Status: Appears within functional limits for tasks assessed/performed Arousal/Alertness: Awake/alert Orientation Level: Oriented X4 / Intact Behavior During Session: [facility] for tasks performed    Mobility     Exercises    Balance    End of Session OT - End of Session Activity Tolerance: Patient tolerated treatment well Patient left: in bed;with call bell/phone within reach;with family/visitor present  GO    04/25/2012 Cipriano Mile OTR/L Pager [phone] Office [phone]  Cipriano Mile 04/25/2012, 5:27 PM

## 2012-04-25 NOTE — Progress Notes (Signed)
Doing well. C/o appropriate incisional soreness. Was OOB yesterday  Temp:  [98.2 F (36.8 C)-98.7 F (37.1 C)] 98.5 F (36.9 C) (08/11 0650) Pulse Rate:  [90-111] 94  (08/11 0650) Resp:  [16-18] 18  (08/11 0650) BP: (130-138)/(83-84) 130/84 mmHg (08/11 0650) SpO2:  [97 %-100 %] 100 % (08/11 0900) Good strength and sensation (R LE cast) Incision CDI  Plan: CPM

## 2012-04-25 NOTE — Progress Notes (Signed)
<  principal problem not specified>  Subjective: Having gas and crampy abd pain. No nausea, not vomited has passed some gas and a small BM.  Objective: Vital signs in last 24 hours: Temp:  [98.2 F (36.8 C)-98.7 F (37.1 C)] 98.5 F (36.9 C) (08/11 0650) Pulse Rate:  [90-111] 94  (08/11 0650) Resp:  [16-18] 18  (08/11 0650) BP: (130-138)/(83-84) 130/84 mmHg (08/11 0650) SpO2:  [97 %-100 %] 100 % (08/11 0900) Last BM Date: 04/24/12  Intake/Output from previous day: 08/10 0701 - 08/11 0700 In: -  Out: 2910 [Urine:2800; Drains:110] Intake/Output this shift: Total I/O In: -  Out: 1500 [Urine:1500]  General appearance: alert and mild distress Resp: clear to auscultation bilaterally GI: Soft, minimally distended, less than yesterday, no significant tenderness and no rebound or guarding BS+  Lab Results:  No results found for this or any previous visit (from the past 24 hour(s)).   Studies/Results Radiology     MEDS, Scheduled    . albuterol-ipratropium  2 puff Inhalation BID  . antiseptic oral rinse  15 mL Mouth Rinse BID  . bisacodyl  10 mg Rectal Once  . docusate sodium  200 mg Oral BID  . enoxaparin (LOVENOX) injection  40 mg Subcutaneous Q24H  . HYDROmorphone PCA 0.3 mg/mL   Intravenous Q4H  . methocarbamol  1,000 mg Oral QID  . metoCLOPramide (REGLAN) injection  10 mg Intravenous Q6H  . multivitamin with minerals  1 tablet Oral Daily  . pantoprazole (PROTONIX) IV  40 mg Intravenous QHS  . polyethylene glycol  17 g Oral Daily  . senna  1 tablet Oral BID  . sodium chloride  3 mL Intravenous Q12H  . traMADol  100 mg Oral Q6H     Assessment: <principal problem not specified> Apparent ileus  Plan: Will check abd xray today. Abd seems definitely less distended than yesterday and she has passed some gas. WBC was wnl today  LOS: 8 days    Currie Paris, MD, [facility], [place] [phone]   04/25/2012 11:24 AM

## 2012-04-25 NOTE — Progress Notes (Signed)
MD notified per patient/family request for increase in abdominal pain and discomfort.  Patient is tearful, and describes "sharp, shooting pains in her lower stomach with rumbling".  Patient's abdomen is slightly distended on assessment with +BS.  Patient reports +gas and at least 4 very small watery BMs in the past 24 hours.  Patient has refused scheduled miralax and doesn't want a dulcolax suppository.  Vital signs remain stable.  No new orders given by MD at this time.  MD will round on patient today.  Will continue to monitor.

## 2012-04-26 ENCOUNTER — Encounter (HOSPITAL_COMMUNITY): Admission: EM | Disposition: A | Discharge status: Rehabilitation Facility | Payer: Self-pay | Source: Home / Self Care

## 2012-04-26 LAB — CDS SEROLOGY

## 2012-04-26 SURGERY — CLOSED REDUCTION, FRACTURE, NASAL BONE
Anesthesia: General

## 2012-04-26 MED ORDER — PANTOPRAZOLE SODIUM 40 MG PO TBEC
40.0000 mg | DELAYED_RELEASE_TABLET | Freq: Every day | ORAL | Status: DC
  Administered 2012-04-27: 40 mg via ORAL
  Filled 2012-04-26: qty 1

## 2012-04-26 MED ORDER — OXYCODONE-ACETAMINOPHEN 5-325 MG PO TABS
1.0000 | ORAL_TABLET | ORAL | Status: DC | PRN
  Administered 2012-04-26 – 2012-04-27 (×2): 2 via ORAL
  Filled 2012-04-26 (×2): qty 2

## 2012-04-26 MED ORDER — AMPICILLIN 500 MG PO CAPS
500.0000 mg | ORAL_CAPSULE | Freq: Three times a day (TID) | ORAL | Status: DC
  Administered 2012-04-26 – 2012-04-27 (×4): 500 mg via ORAL
  Filled 2012-04-26 (×8): qty 1

## 2012-04-26 MED ORDER — HYDROMORPHONE HCL PF 1 MG/ML IJ SOLN
0.5000 mg | INTRAMUSCULAR | Status: DC | PRN
  Administered 2012-04-27: 1 mg via INTRAVENOUS
  Filled 2012-04-26: qty 1

## 2012-04-26 NOTE — Progress Notes (Signed)
Occupational Therapy Treatment Patient Details Name: Ana [name] Today's Date: 04/26/2012 Time: 1100-1136 OT Time Calculation (min): 36 min  OT Assessment / Plan / Recommendation Comments on Treatment Session Pt progressing well with tolerating splint and splint checks. Pt with decreased edema and will need to be monitored closely this week for possible splint fabrication or reduction of current splint.    Follow Up Recommendations  Inpatient Rehab    Barriers to Discharge       Equipment Recommendations  Defer to next venue    Recommendations for Other Services Rehab consult  Frequency Min 3X/week   Plan Discharge plan remains appropriate    Precautions / Restrictions Precautions Precautions: Back;Fall Precaution Booklet Issued: No Precaution Comments: no supination pronation, elbow flexion to 90 degrees Required Braces or Orthoses: Other Brace/Splint Spinal Brace: Thoracolumbosacral orthotic;Applied in sitting position Other Brace/Splint: elbow splint Restrictions [name] Weight Bearing: Non weight bearing [name] Weight Bearing: Partial weight bearing [name] Partial Weight Bearing Percentage or Pounds: 50%   Pertinent Vitals/Pain none    ADL  ADL Comments: Assisted with dressing change and fabrication of splint. This required 2 person assist to extensive incisions, and pt. unable to assist with holding or placing [name]. Pt's mother (A)ing in holding [name] necessary to insure pt safety with precautions, and to properly position. PT and mother educated on UE positioning to decrease edema.pt with x3 blisters noted on orignial eval with decreased size today. Pt noted to have redness at medial aspect at the wrist from splint. pt provided second set of gauze wrapping to increase comfort prevent pressure at the wrist and proper fit of the splint. Pt will need splint fit and size readdressed wednesday 04/28/12 by OT. Pt tolerating splint well      OT  Goals Acute Rehab OT Goals OT Goal Formulation: With patient/family Time For Goal Achievement: 05/07/12 Potential to Achieve Goals: Good ADL Goals Pt Will Perform Grooming: Supported;Sitting, chair;with min assist Pt Will Transfer to Toilet: with max assist;3-in-1;Stand pivot transfer Arm Goals Pt Will Perform AROM: Left upper extremity;Other (comment) ((fingers ONLY. (flexion/extension / opposition))) Arm Goal: AROM - Progress: Progressing toward goal Pt Will Tolerate PROM: Left upper extremity;Other (comment);to maintain range of motion Arm Goal: PROM - Progress: Progressing toward goal Miscellaneous OT Goals Miscellaneous OT Goal #1: Pt will tolerate splint for entire day without sign or symptoms of skin breakdown. OT Goal: Miscellaneous Goal #1 - Progress: Progressing toward goals Miscellaneous OT Goal #2: Pt will verbalize and demonstrate edema management with [name] elevated and supported on pillow Supervision OT Goal: Miscellaneous Goal #2 - Progress: Met  Visit Information  Last OT Received On: 04/26/12 Assistance Needed: +1    Subjective Data      Prior Functioning       Cognition  Overall Cognitive Status: Appears within functional limits for tasks assessed/performed Arousal/Alertness: Awake/alert Orientation Level: Oriented X4 / Intact Behavior During Session: [facility] for tasks performed    Mobility     Exercises Other Exercises Other Exercises: pt performed pad to pad x15 reps and digit flexion/ extension x 15 reps.  Balance    End of Session OT - End of Session Activity Tolerance: Patient tolerated treatment well Patient left: in bed;with call bell/phone within reach;with family/visitor present Nurse Communication: Mobility status;Precautions  GO     Ana [name] 04/26/2012, 1:55 PM Pager: [phone]
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
## 2012-04-26 NOTE — Progress Notes (Signed)
Doing fine.  Will DC PCA.  Advance. Rehab consultation?  This patient has been seen and I agree with the findings and treatment plan.  Marta Lamas. Gae Bon, MD, FACS [phone] (pager) [phone] (direct pager) Trauma Surgeon
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
## 2012-04-26 NOTE — Progress Notes (Signed)
UR complete 
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
## 2012-04-26 NOTE — Progress Notes (Signed)
Occupational Therapy Treatment Patient Details Name: Ana [name] MRN: [id number] DOB: April 23, 1981 Today's Date: 04/26/2012 Time: 4098-1191 OT Time Calculation (min): 30 min  OT Assessment / Plan / Recommendation Comments on Treatment Session Pt progressing well and decreased time required for don of TLSO due to patient demonstrates increased confidence with bed mobility. Pt positive and verbalizing the desire to get well soon and move to CIR.    Follow Up Recommendations  Inpatient Rehab    Barriers to Discharge       Equipment Recommendations  Defer to next venue    Recommendations for Other Services Rehab consult  Frequency Min 3X/week   Plan Discharge plan remains appropriate    Precautions / Restrictions Precautions Precautions: Back;Fall Precaution Booklet Issued: No Precaution Comments: Re-educated on 3/3 back precautions. Required Braces or Orthoses: Other Brace/Splint Spinal Brace: Thoracolumbosacral orthotic;Applied in sitting position Other Brace/Splint: elbow splint Restrictions Weight Bearing Restrictions: Yes [name] Weight Bearing: Non weight bearing [name] Weight Bearing: Partial weight bearing [name] Partial Weight Bearing Percentage or Pounds: 50%   Pertinent Vitals/Pain None reported    ADL  Equipment Used: Back brace;Gait belt ADL Comments: Pt demonstrates increased independence with bed mobility by using Rt UE to position [name] safely for bed mobility. Pt using Rt UE to pull and push for bed mobility. Pt able to advance BIL LE to eob with Min (A) due to weight of Rt LE. Pt sitting eob for extended time due to dizziness and resolved within ~2 minutes with sitting and tapping Lt LE ankle pumps. Pt positioned in chair and rolled around 5N tower. pt provided music and singing along while being pushed around unit. Pt appreciative of getting out of the room for the first time and it not being for testing. Encourage RN staff to (A) patient daily OOB to chair. Strongly  recommend CIR admission to increase independence.       OT Goals Acute Rehab OT Goals OT Goal Formulation: With patient/family Time For Goal Achievement: 05/07/12 Potential to Achieve Goals: Good ADL Goals Pt Will Perform Grooming: Supported;Sitting, chair;with min assist Pt Will Transfer to Toilet: with max assist;3-in-1;Stand pivot transfer ADL Goal: Toilet Transfer - Progress: Progressing toward goals Arm Goals Pt Will Perform AROM: Left upper extremity;Other (comment) Arm Goal: AROM - Progress: Progressing toward goal Pt Will Tolerate PROM: Left upper extremity;Other (comment);to maintain range of motion Arm Goal: PROM - Progress: Progressing toward goal Miscellaneous OT Goals Miscellaneous OT Goal #1: Pt will tolerate splint for entire day without sign or symptoms of skin breakdown. OT Goal: Miscellaneous Goal #1 - Progress: Progressing toward goals Miscellaneous OT Goal #2: Pt will verbalize and demonstrate edema management with [name] elevated and supported on pillow Supervision OT Goal: Miscellaneous Goal #2 - Progress: Met  Visit Information  Last OT Received On: 04/26/12 Assistance Needed: +2 PT/OT Co-Evaluation/Treatment: Yes               Cognition  Overall Cognitive Status: Appears within functional limits for tasks assessed/performed Arousal/Alertness: Awake/alert Orientation Level: Oriented X4 / Intact Behavior During Session: [facility] for tasks performed    Mobility Bed Mobility Bed Mobility: Rolling Right;Right Sidelying to Sit Rolling Right: 4: Min assist;With rail Right Sidelying to Sit: 1: +2 Total assist;HOB flat Right Sidelying to Sit: Patient Percentage: 60% Sitting - Scoot to Edge of Bed: 1: +2 Total assist Sitting - Scoot to Edge of Bed: Patient Percentage: 30% Details for Bed Mobility Assistance: Assist to rotate pelvis and translate trunk to midline.  Cues for sequence to protect. Transfers Sit to Stand: 1: +2 Total assist;With upper extremity  assist;From bed Sit to Stand: Patient Percentage: 80% Stand to Sit: 1: +2 Total assist;With upper extremity assist;To chair/3-in-1 Stand to Sit: Patient Percentage: 80% Details for Transfer Assistance: Assist for balance and to translate trunk anterior while maintaining WBing status of left UE and right LE throughout session.  Cues for sequence.   Exercises Other Exercises Other Exercises: pt performed pad to pad x15 reps and digit flexion/ extension x 15 reps.  Balance Balance Balance Assessed: No  End of Session OT - End of Session Activity Tolerance: Patient tolerated treatment well Patient left: in chair;with call bell/phone within reach Nurse Communication: Mobility status;Precautions  GO     Lucile Shutters 04/26/2012, 3:04 PM Pager: [phone]
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
## 2012-04-26 NOTE — Progress Notes (Signed)
Clinicals faxed to [facility] with request for authorization for CIR admission later this week.

## 2012-04-26 NOTE — Progress Notes (Signed)
Inpatient Rehab Admissions: Met with pt and her mother. Both are enthusiastic about the plan for coming to rehab soon. Discussed rehab routine, what to expect, answered questions.  CIR bed available when pt medically stable. Call for questions: [phone].
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
## 2012-04-27 ENCOUNTER — Inpatient Hospital Stay (HOSPITAL_COMMUNITY)
Admission: RE | Admit: 2012-04-27 | Discharge: 2012-05-06 | DRG: 945 | Disposition: A | Discharge status: Home / Self Care | Payer: 59 | Source: Ambulatory Visit | Attending: Physical Medicine & Rehabilitation | Admitting: Physical Medicine & Rehabilitation

## 2012-04-27 ENCOUNTER — Encounter (HOSPITAL_COMMUNITY): Payer: Self-pay | Admitting: *Deleted

## 2012-04-27 DIAGNOSIS — N390 Urinary tract infection, site not specified: Secondary | ICD-10-CM | POA: Diagnosis present

## 2012-04-27 DIAGNOSIS — S5290XA Unspecified fracture of unspecified forearm, initial encounter for closed fracture: Secondary | ICD-10-CM | POA: Diagnosis present

## 2012-04-27 DIAGNOSIS — S069X9A Unspecified intracranial injury with loss of consciousness of unspecified duration, initial encounter: Secondary | ICD-10-CM

## 2012-04-27 DIAGNOSIS — S0230XA Fracture of orbital floor, unspecified side, initial encounter for closed fracture: Secondary | ICD-10-CM | POA: Diagnosis present

## 2012-04-27 DIAGNOSIS — Z5189 Encounter for other specified aftercare: Principal | ICD-10-CM

## 2012-04-27 DIAGNOSIS — B952 Enterococcus as the cause of diseases classified elsewhere: Secondary | ICD-10-CM | POA: Diagnosis present

## 2012-04-27 DIAGNOSIS — R209 Unspecified disturbances of skin sensation: Secondary | ICD-10-CM | POA: Diagnosis present

## 2012-04-27 DIAGNOSIS — S0240DA Maxillary fracture, left side, initial encounter for closed fracture: Secondary | ICD-10-CM | POA: Diagnosis present

## 2012-04-27 DIAGNOSIS — K560 Paralytic ileus: Secondary | ICD-10-CM | POA: Diagnosis present

## 2012-04-27 DIAGNOSIS — S32009A Unspecified fracture of unspecified lumbar vertebra, initial encounter for closed fracture: Secondary | ICD-10-CM | POA: Diagnosis present

## 2012-04-27 DIAGNOSIS — S52209A Unspecified fracture of shaft of unspecified ulna, initial encounter for closed fracture: Secondary | ICD-10-CM | POA: Diagnosis present

## 2012-04-27 DIAGNOSIS — S069XAA Unspecified intracranial injury with loss of consciousness status unknown, initial encounter: Secondary | ICD-10-CM

## 2012-04-27 DIAGNOSIS — S82409B Unspecified fracture of shaft of unspecified fibula, initial encounter for open fracture type I or II: Secondary | ICD-10-CM | POA: Diagnosis present

## 2012-04-27 DIAGNOSIS — S42409B Unspecified fracture of lower end of unspecified humerus, initial encounter for open fracture: Secondary | ICD-10-CM | POA: Diagnosis present

## 2012-04-27 DIAGNOSIS — S42309A Unspecified fracture of shaft of humerus, unspecified arm, initial encounter for closed fracture: Secondary | ICD-10-CM | POA: Diagnosis present

## 2012-04-27 DIAGNOSIS — S82401B Unspecified fracture of shaft of right fibula, initial encounter for open fracture type I or II: Secondary | ICD-10-CM | POA: Diagnosis present

## 2012-04-27 DIAGNOSIS — S022XXA Fracture of nasal bones, initial encounter for closed fracture: Secondary | ICD-10-CM | POA: Diagnosis present

## 2012-04-27 DIAGNOSIS — S82209B Unspecified fracture of shaft of unspecified tibia, initial encounter for open fracture type I or II: Secondary | ICD-10-CM

## 2012-04-27 DIAGNOSIS — L299 Pruritus, unspecified: Secondary | ICD-10-CM | POA: Diagnosis present

## 2012-04-27 DIAGNOSIS — S32039A Unspecified fracture of third lumbar vertebra, initial encounter for closed fracture: Secondary | ICD-10-CM | POA: Diagnosis present

## 2012-04-27 MED ORDER — ADULT MULTIVITAMIN W/MINERALS CH
1.0000 | ORAL_TABLET | Freq: Every day | ORAL | Status: DC
  Administered 2012-04-28 – 2012-05-06 (×9): 1 via ORAL
  Filled 2012-04-27 (×10): qty 1

## 2012-04-27 MED ORDER — LORATADINE 10 MG PO TABS
10.0000 mg | ORAL_TABLET | Freq: Every day | ORAL | Status: DC | PRN
  Filled 2012-04-27: qty 1

## 2012-04-27 MED ORDER — ONDANSETRON HCL 4 MG/2ML IJ SOLN: 4.0000 mg | Freq: Four times a day (QID) | INTRAMUSCULAR | Status: DC | PRN

## 2012-04-27 MED ORDER — PANTOPRAZOLE SODIUM 40 MG PO TBEC
40.0000 mg | DELAYED_RELEASE_TABLET | Freq: Every day | ORAL | Status: DC
  Administered 2012-04-28 – 2012-05-06 (×9): 40 mg via ORAL
  Filled 2012-04-27 (×10): qty 1

## 2012-04-27 MED ORDER — ENOXAPARIN SODIUM 40 MG/0.4ML ~~LOC~~ SOLN
40.0000 mg | SUBCUTANEOUS | Status: DC
  Administered 2012-04-28 – 2012-05-05 (×8): 40 mg via SUBCUTANEOUS
  Filled 2012-04-27 (×9): qty 0.4

## 2012-04-27 MED ORDER — METOCLOPRAMIDE HCL 10 MG PO TABS
10.0000 mg | ORAL_TABLET | Freq: Three times a day (TID) | ORAL | Status: DC
  Administered 2012-04-27: 10 mg via ORAL
  Filled 2012-04-27: qty 1

## 2012-04-27 MED ORDER — POLYETHYLENE GLYCOL 3350 17 G PO PACK
17.0000 g | PACK | Freq: Two times a day (BID) | ORAL | Status: DC
  Administered 2012-04-27 – 2012-05-01 (×8): 17 g via ORAL
  Filled 2012-04-27 (×10): qty 1

## 2012-04-27 MED ORDER — POLYVINYL ALCOHOL 1.4 % OP SOLN
1.0000 [drp] | OPHTHALMIC | Status: DC | PRN
  Filled 2012-04-27: qty 15

## 2012-04-27 MED ORDER — GUAIFENESIN-DM 100-10 MG/5ML PO SYRP: 5.0000 mL | ORAL_SOLUTION | Freq: Four times a day (QID) | ORAL | Status: DC | PRN

## 2012-04-27 MED ORDER — MENTHOL 3 MG MT LOZG
1.0000 | LOZENGE | OROMUCOSAL | Status: DC | PRN
  Filled 2012-04-27: qty 9

## 2012-04-27 MED ORDER — FLEET ENEMA 7-19 GM/118ML RE ENEM: 1.0000 | ENEMA | Freq: Once | RECTAL | Status: AC | PRN

## 2012-04-27 MED ORDER — ACETAMINOPHEN 325 MG PO TABS: 325.0000 mg | ORAL_TABLET | ORAL | Status: DC | PRN

## 2012-04-27 MED ORDER — ALUM & MAG HYDROXIDE-SIMETH 200-200-20 MG/5ML PO SUSP: 30.0000 mL | Freq: Four times a day (QID) | ORAL | Status: DC | PRN

## 2012-04-27 MED ORDER — OXYCODONE HCL 5 MG PO TABS
5.0000 mg | ORAL_TABLET | ORAL | Status: DC | PRN
  Administered 2012-04-28 – 2012-05-05 (×13): 10 mg via ORAL
  Administered 2012-05-05: 5 mg via ORAL
  Administered 2012-05-06: 10 mg via ORAL
  Filled 2012-04-27 (×2): qty 2
  Filled 2012-04-27: qty 1
  Filled 2012-04-27 (×4): qty 2
  Filled 2012-04-27: qty 1
  Filled 2012-04-27 (×5): qty 2
  Filled 2012-04-27: qty 1
  Filled 2012-04-27 (×2): qty 2

## 2012-04-27 MED ORDER — TRAMADOL HCL 50 MG PO TABS
100.0000 mg | ORAL_TABLET | Freq: Four times a day (QID) | ORAL | Status: DC
  Administered 2012-04-27 – 2012-05-05 (×30): 100 mg via ORAL
  Filled 2012-04-27 (×18): qty 2
  Filled 2012-04-27 (×2): qty 1
  Filled 2012-04-27 (×24): qty 2

## 2012-04-27 MED ORDER — DOCUSATE SODIUM 100 MG PO CAPS
200.0000 mg | ORAL_CAPSULE | Freq: Two times a day (BID) | ORAL | Status: DC
  Administered 2012-04-27 – 2012-05-06 (×13): 200 mg via ORAL
  Filled 2012-04-27 (×20): qty 2

## 2012-04-27 MED ORDER — METOCLOPRAMIDE HCL 10 MG PO TABS
10.0000 mg | ORAL_TABLET | Freq: Three times a day (TID) | ORAL | Status: DC
  Administered 2012-04-27 – 2012-05-01 (×11): 10 mg via ORAL
  Filled 2012-04-27 (×15): qty 1

## 2012-04-27 MED ORDER — METHOCARBAMOL 500 MG PO TABS
1000.0000 mg | ORAL_TABLET | Freq: Four times a day (QID) | ORAL | Status: DC
  Administered 2012-04-27 – 2012-05-05 (×31): 1000 mg via ORAL
  Filled 2012-04-27 (×18): qty 2
  Filled 2012-04-27: qty 1
  Filled 2012-04-27 (×3): qty 2
  Filled 2012-04-27: qty 1
  Filled 2012-04-27 (×13): qty 2

## 2012-04-27 MED ORDER — IPRATROPIUM-ALBUTEROL 18-103 MCG/ACT IN AERO
2.0000 | INHALATION_SPRAY | Freq: Two times a day (BID) | RESPIRATORY_TRACT | Status: DC
  Administered 2012-04-28 – 2012-04-30 (×6): 2 via RESPIRATORY_TRACT
  Filled 2012-04-27: qty 14.7

## 2012-04-27 MED ORDER — TRAZODONE HCL 50 MG PO TABS: 25.0000 mg | ORAL_TABLET | Freq: Every evening | ORAL | Status: DC | PRN

## 2012-04-27 MED ORDER — BIOTENE DRY MOUTH MT LIQD
15.0000 mL | Freq: Two times a day (BID) | OROMUCOSAL | Status: DC
  Administered 2012-04-27 – 2012-05-04 (×12): 15 mL via OROMUCOSAL

## 2012-04-27 MED ORDER — AMPICILLIN 500 MG PO CAPS
500.0000 mg | ORAL_CAPSULE | Freq: Three times a day (TID) | ORAL | Status: DC
  Administered 2012-04-27 – 2012-05-03 (×17): 500 mg via ORAL
  Filled 2012-04-27 (×20): qty 1

## 2012-04-27 MED ORDER — BISACODYL 10 MG RE SUPP: 10.0000 mg | Freq: Every day | RECTAL | Status: DC | PRN

## 2012-04-27 MED ORDER — ONDANSETRON HCL 4 MG PO TABS: 4.0000 mg | ORAL_TABLET | Freq: Four times a day (QID) | ORAL | Status: DC | PRN

## 2012-04-27 MED ORDER — DIPHENHYDRAMINE HCL 12.5 MG/5ML PO ELIX
12.5000 mg | ORAL_SOLUTION | Freq: Four times a day (QID) | ORAL | Status: DC | PRN
  Administered 2012-05-03: 25 mg via ORAL
  Filled 2012-04-27: qty 10

## 2012-04-27 MED ORDER — PHENOL 1.4 % MT LIQD
1.0000 | OROMUCOSAL | Status: DC | PRN
  Filled 2012-04-27: qty 177

## 2012-04-27 NOTE — Progress Notes (Signed)
Hope to go to CIR today Improving overall Patient examined and I agree with the assessment and plan  Violeta Gelinas, MD, MPH, FACS Pager: [phone]  04/27/2012 1:01 PM

## 2012-04-27 NOTE — Clinical Social Work Note (Signed)
Clinical Social Worker spoke with patient at bedside to offer continued support and confirm discharge plans.  Patient plans to admit to [facility] today.  Patient with friends at the bedside, therefore not as willing to engage in conversation but did provide permission to contact her father in regards to questions he asked.  Patient is very motivated to get to rehab and eventually get home with her family and children.  Patient seems to be coping appropriately with many friends and family at bedside.  Clinical Social Worker spoke with patient father over the phone to further discuss his questions regarding Disability and Medicaid applications.  CSW contacted financial counselor who states that per MD, patient will not be disabled greater than a year, therefore we would not be able to assist with the Disability application at this time.  Patient father understands and is grateful that the time frame for healing is less than a year.  Patient may qualify for Medicaid following the completion of her short term disability policy, which patient father also understands and states that he will further look into it with DSS.  Patient father verbalized his appreciation for our efforts and was pleased with information regarding patient recovery time frame.  Patient father expressed concerns regarding patient emotional state once no longer hospitalized.  CSW informed patient father that there will be an additional Child psychotherapist following patient during inpatient rehab who will continue to address patient needs as they arise.  CSW to notify CIR social workers of circumstance for follow up.  Clinical Social Worker will sign off for now as social work intervention is no longer needed. Please consult [place] again if new need arises.  Macario Golds, [place] [phone]

## 2012-04-27 NOTE — Progress Notes (Signed)
PT Note:  Pt to transfer to CIR this pm with pt sleeping soundly.  Mother at bedside.  Holding treatment for today in anticipation of transfer.  04/27/2012 Cephus Shelling, PT, DPT [phone]

## 2012-04-27 NOTE — Progress Notes (Signed)
Subjective: Patient reports feeling better  Objective: Vital signs in last 24 hours: Temp:  [98.2 F (36.8 C)-99.8 F (37.7 C)] 98.5 F (36.9 C) (08/13 0538) Pulse Rate:  [81-97] 81  (08/13 0538) Resp:  [18] 18  (08/13 0538) BP: (121)/(73-85) 121/85 mmHg (08/13 0538) SpO2:  [97 %-99 %] 98 % (08/13 0538)  Intake/Output from previous day:   Intake/Output this shift:    Wound:c/d/i  - drain removed  Lab Results: No results found for this basename: WBC:2,HGB:2,HCT:2,PLT:2 in the last 72 hours BMET No results found for this basename: NA:2,K:2,CL:2,CO2:2,GLUCOSE:2,BUN:2,CREATININE:2,CALCIUM:2 in the last 72 hours  Studies/Results: Dg Abd 2 Views  05-24-2012  *RADIOLOGY REPORT*  Clinical Data: Abdominal pain and distension, no bowel movements since lumbar surgery  ABDOMEN - 2 VIEW  Comparison: Abdominal radiographs - 04/22/2012; chest radiograph - 04/22/2012; chest CT - 04/22/2012  Findings:  There is mild to moderate gaseous distension of predominantly the colon with mild dilatation of several small bowel loops within the midline of the abdomen, minimally improved from prior examination. No definite pneumoperitoneum, pneumatosis or portal venous gas.  A linear opacity overlying the right upper abdominal quadrant and left paraspinal spine may be external to the patient or represent a surgical drain.  Limited visualization of the lower thorax suggest borderline enlargement of the cardiac silhouette.  Minimal bibasilar heterogeneous opacities.  Post long segment paraspinal fusion.  IMPRESSION: Overall findings compatible with minimally improved adynamic ileus.  Original Report Authenticated By: Waynard Reeds, M.D.    Assessment/Plan: Pt making progress - to rehab today -     Change dressing prn - when no drainage , no dressing needed -   F/U with Dr Jordan Likes after d/c from rehab - recall if we can be of any further assistance  LOS: 10 days     Evaleen Sant R, MD 04/27/2012, 11:12 AM
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
## 2012-04-27 NOTE — Discharge Summary (Signed)
Lexie Morini, MD, MPH, FACS Pager: [phone]  
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
## 2012-04-27 NOTE — Progress Notes (Signed)
Patient arrived to unit around 1530. Mother at the bedside with patient and rehab booklet was given along with safety plan explained. Patient verbalized understanding and has reported watching the safety video. Explained rehab routines, patient verbalizes understanding and eager to begin rehab. Incentive Spirometry given, patient demonstrated correct use. Complaints of pain with scheduled tramadol given. No questions about rehab at this time.
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
## 2012-04-27 NOTE — Progress Notes (Signed)
Occupational Therapy Treatment Patient Details Name: Ana [name] MRN: [id number] DOB: 1981-04-21 Today's Date: 04/27/2012 Time: 4098-1191 OT Time Calculation (min): 25 min  OT Assessment / Plan / Recommendation Comments on Treatment Session Pt. did very well with initial gentle elbow flex/ext.    Pt. movtivated    Follow Up Recommendations  Inpatient Rehab    Barriers to Discharge       Equipment Recommendations  Defer to next venue    Recommendations for Other Services    Frequency Min 3X/week   Plan Discharge plan remains appropriate    Precautions / Restrictions Precautions Precautions: Back;Fall (gentle elbow and wrist ROM [name]) Precaution Booklet Issued: No Precaution Comments: Re-educated on 3/3 back precautions. Required Braces or Orthoses: Other Brace/Splint Spinal Brace: Thoracolumbosacral orthotic;Applied in sitting position Other Brace/Splint: elbow splint Restrictions Weight Bearing Restrictions: Yes [name] Weight Bearing: Non weight bearing [name] Weight Bearing: Partial weight bearing [name] Partial Weight Bearing Percentage or Pounds: 50%   Pertinent Vitals/Pain     ADL  ADL Comments: Pt. now with orders for gentle ROM Lt. elbow and wrist.  Pt. tolerated well.  Pt. reports splint with no pain, irritation    OT Diagnosis:    OT Problem List:   OT Treatment Interventions:     OT Goals Arm Goals Arm Goal: AROM - Progress: Progressing toward goal Miscellaneous OT Goals OT Goal: Miscellaneous Goal #2 - Progress: Progressing toward goals  Visit Information  Last OT Received On: 04/27/12 Assistance Needed: +2    Subjective Data      Prior Functioning       Cognition  Overall Cognitive Status: Appears within functional limits for tasks assessed/performed Arousal/Alertness: Awake/alert Orientation Level: Oriented X4 / Intact Behavior During Session: [facility] for tasks performed    Mobility     Exercises General Exercises - Upper Extremity Shoulder  Flexion: AAROM;Left;10 reps;Supine Shoulder Extension: AAROM;Left;10 reps;Supine Shoulder ABduction: AAROM;10 reps;Supine Shoulder ADduction: AAROM;10 reps;Supine;Left Elbow Flexion: AAROM;Left;10 reps;Supine (to ~ 140*) Elbow Extension: AAROM;Left;10 reps;Supine (~-30*) Wrist Flexion: AROM;Left;10 reps (~20*) Wrist Extension: AROM;Left;10 reps;Supine (~30*) Digit Composite Flexion: AROM;Left;10 reps Composite Extension: AROM;Left;10 reps  Balance    End of Session OT - End of Session Activity Tolerance: Patient tolerated treatment well Patient left: in bed;with call bell/phone within reach;with family/visitor present  GO     Seferino Oscar, Ursula Alert M 04/27/2012, 11:18 AM
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
## 2012-04-28 ENCOUNTER — Inpatient Hospital Stay (HOSPITAL_COMMUNITY): Payer: 59 | Admitting: Speech Pathology

## 2012-04-28 ENCOUNTER — Inpatient Hospital Stay (HOSPITAL_COMMUNITY): Payer: 59 | Admitting: *Deleted

## 2012-04-28 ENCOUNTER — Inpatient Hospital Stay (HOSPITAL_COMMUNITY): Payer: 59 | Admitting: Occupational Therapy

## 2012-04-28 ENCOUNTER — Inpatient Hospital Stay (HOSPITAL_COMMUNITY): Payer: 59 | Admitting: Physical Therapy

## 2012-04-28 DIAGNOSIS — S32009A Unspecified fracture of unspecified lumbar vertebra, initial encounter for closed fracture: Secondary | ICD-10-CM

## 2012-04-28 DIAGNOSIS — S82209B Unspecified fracture of shaft of unspecified tibia, initial encounter for open fracture type I or II: Secondary | ICD-10-CM

## 2012-04-28 DIAGNOSIS — S069X9A Unspecified intracranial injury with loss of consciousness of unspecified duration, initial encounter: Secondary | ICD-10-CM

## 2012-04-28 DIAGNOSIS — S5290XA Unspecified fracture of unspecified forearm, initial encounter for closed fracture: Secondary | ICD-10-CM

## 2012-04-28 DIAGNOSIS — S82409B Unspecified fracture of shaft of unspecified fibula, initial encounter for open fracture type I or II: Secondary | ICD-10-CM

## 2012-04-28 LAB — COMPREHENSIVE METABOLIC PANEL
ALT: 53 U/L — ABNORMAL HIGH (ref 0–35)
Albumin: 2.8 g/dL — ABNORMAL LOW (ref 3.5–5.2)
Alkaline Phosphatase: 116 U/L (ref 39–117)
Calcium: 9.3 mg/dL (ref 8.4–10.5)
Potassium: 3.8 mEq/L (ref 3.5–5.1)
Sodium: 138 mEq/L (ref 135–145)
Total Protein: 7.3 g/dL (ref 6.0–8.3)

## 2012-04-28 LAB — CBC WITH DIFFERENTIAL/PLATELET
Basophils Absolute: 0.1 10*3/uL (ref 0.0–0.1)
Basophils Relative: 1 % (ref 0–1)
Eosinophils Absolute: 0.3 10*3/uL (ref 0.0–0.7)
MCH: 29.5 pg (ref 26.0–34.0)
MCHC: 34.5 g/dL (ref 30.0–36.0)
Neutrophils Relative %: 74 % (ref 43–77)
Platelets: 532 10*3/uL — ABNORMAL HIGH (ref 150–400)
RBC: 3.96 MIL/uL (ref 3.87–5.11)
RDW: 12.8 % (ref 11.5–15.5)

## 2012-04-28 LAB — HIV ANTIBODY (ROUTINE TESTING W REFLEX): HIV: NONREACTIVE
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
## 2012-04-28 NOTE — Progress Notes (Signed)
Patient information reviewed and entered into [employer] system by Ronda Rajkumar, RN, CRRN, PPS Coordinator.  Information including medical coding and functional independence measure will be reviewed and updated through discharge.    

## 2012-04-28 NOTE — Progress Notes (Signed)
Physical Therapy Session Note  Patient Details  Name: Tameaka Eichhorn MRN: [id number] Date of Birth: [birth year]  Today's Date: 04/28/2012 Time: 5621-3086 Time Calculation (min): 33 min  Short Term Goals: Week 1:  PT Short Term Goal 1 (Week 1): Same as LTGs  Skilled Therapeutic Interventions/Progress Updates:   See below for details.  Spoke with Elita Quick, PA, during session and requested an order for an arm sling for support when standing.  Also, discussed if pt could use knee walker once pt has a hard cast on the [name] and PA agreed.   Therapy Documentation Precautions:  Precautions Precautions: Back Precaution Booklet Issued: No Precaution Comments: Re-educated on 3/3 back precautions. Patient able to state 3/3 precautions Required Braces or Orthoses: Other Brace/Splint Spinal Brace: Thoracolumbosacral orthotic;Applied in supine position Other Brace/Splint: Elbow splint to be worn at all times except with dressing changes.  Elbow at 75-80 degrees and wrist in neutral. Restrictions Weight Bearing Restrictions: Yes [name] Weight Bearing: Non weight bearing [name] Weight Bearing: Partial weight bearing [name] Partial Weight Bearing Percentage or Pounds: 50% Other Position/Activity Restrictions: HOB can be up 30 degrees without brace on Pain: Pain Assessment Pain Assessment: No/denies pain Pain Score: 0-No pain Mobility:  Sit to stand with mod@, using hemi-walker.  Car transfer with mod@, discussed height ranges needed for easy transfers when determining what type of car to use at discharge. Balance:  Static standing with hemiwalker x 3 min for endurance and strengthening with initially mod@ progressing to min@.   See FIM for current functional status  Therapy/Group: Individual Therapy  [name] 04/28/2012, 3:36 PM
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
## 2012-04-29 ENCOUNTER — Inpatient Hospital Stay (HOSPITAL_COMMUNITY): Payer: 59

## 2012-04-29 ENCOUNTER — Inpatient Hospital Stay (HOSPITAL_COMMUNITY): Payer: 59 | Admitting: Physical Therapy

## 2012-04-29 MED ORDER — BOOST / RESOURCE BREEZE PO LIQD
1.0000 | Freq: Two times a day (BID) | ORAL | Status: DC
  Administered 2012-04-29: 1 via ORAL

## 2012-04-29 NOTE — Progress Notes (Signed)
Occupational Therapy Session Note  Patient Details  Name: Ana [name] MRN: [id number] Date of Birth: May 18, 1981  Today's Date: 04/29/2012 Time: 0905-1000 Time Calculation (min): 55 min  Skilled Therapeutic Interventions/Progress Updates:  No complaints of pain Upon entering room patient supine in bed. Engaged in UB bathing and dressing supine in bed in order to donn TLSO in supine position. Patient required total assist for UB bathing & dressing and to donn TLSO. Patient then requested to use bathroom and initially requested bed pan. Therapist discussed importance of using BSC, patient willing and engaged in bed mobility for edge of bed -> drop arm BSC transfer with moderate assistance. Patient then transferred back to bed to complete LB bathing & dressing with total assist from therapist. Patient transferred into w/c from edge of bed; mod assist stand pivot transfer. Patient then sat at sink to perform grooming tasks. At end of session left patient seated in w/c with call bell & phone within reach.   Precautions:  Precautions Precautions: Back Precaution Booklet Issued: No Precaution Comments: Re-educated on 3/3 back precautions. Patient able to state 1/3 precautions Required Braces or Orthoses: Other Brace/Splint Spinal Brace: Thoracolumbosacral orthotic;Applied in supine position Other Brace/Splint: Elbow splint to be worn at all times except with dressing changes.  Elbow at 75-80 degrees and wrist in neutral. Restrictions Weight Bearing Restrictions: Yes [name] Weight Bearing: Non weight bearing [name] Weight Bearing: Partial weight bearing [name] Partial Weight Bearing Percentage or Pounds: 50 Other Position/Activity Restrictions: HOB can be up 30 degrees without brace on  See FIM for current functional status  Therapy/Group: Individual Therapy  [name] 04/29/2012, 10:43 AM
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
## 2012-04-29 NOTE — Progress Notes (Signed)
1710 Evelyn, RN from orthopedics removed staples from left arm.  Dry dressing with gauze and kerlex; orthopedic arm brace applied.

## 2012-04-29 NOTE — Progress Notes (Signed)
Physical Therapy Session Note  Patient Details  Name: Ana [name] MRN: [id number] Date of Birth: Mar 04, 1981  Today's Date: 04/29/2012 Time: 1520-1600 Time Calculation (min): 40 min  Short Term Goals: Week 1:  PT Short Term Goal 1 (Week 1): Same as LTGs  Skilled Therapeutic Interventions/Progress Updates:    Stand pivot transfers bed <> wheelchair performed with min assist to Rt., mod assist to Lt. Standing balance tasks in kitchen reaching for items within back precautions with stand by assist. Practiced a ~3 steps forwards and backwards utilizing hemiwalker with max assist. Performed secondary to fact pt's toilet would require her to step 3' backwards from edge of door. Pt felt this was too difficult and she will prefer to utilize bedside commode at this time.    Therapy Documentation Precautions:  Precautions Precautions: Back;Fall (gentle elbow and wrist ROM [name]) Precaution Booklet Issued: No Precaution Comments: Patient able to state 3/3 back precautions Required Braces or Orthoses: Other Brace/Splint Spinal Brace: Thoracolumbosacral orthotic;Applied in sitting position Other Brace/Splint: Elbow splint to be worn at all times except with dressing changes.  Elbow at 75-80 degrees and wrist in neutral. Restrictions Weight Bearing Restrictions: Yes [name] Weight Bearing: Non weight bearing [name] Weight Bearing: Partial weight bearing [name] Partial Weight Bearing Percentage or Pounds: 50%  Pain: Pain Assessment Pain Assessment: 0-10 Pain Score:  8 Pain Type: Acute pain;Surgical pain Pain Location: Back Pain Orientation: Lower Pain Descriptors: Aching Pain Onset: With Activity Pain Intervention(s): RN made aware;Repositioned;Rest Locomotion : Ambulation Ambulation/Gait Assistance: 2: Max assist Wheelchair Mobility Distance: 100'   See FIM for current functional status  Therapy/Group: Individual Therapy  [name] 04/29/2012, 5:39 PM
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
## 2012-04-29 NOTE — Progress Notes (Signed)
Physical Therapy Session Note  Patient Details  Name: Ana [name] MRN: [id number] Date of Birth: 1981/08/24  Today's Date: 04/29/2012 Time: 4098-1191 Time Calculation (min): 44 min  Short Term Goals: Week 1:  PT Short Term Goal 1 (Week 1): Same as LTGs  Skilled Therapeutic Interventions/Progress Updates:    Discussed need for ramp, pt continuing to research options and reports headway. Pt given ramp building handout. Pt propelled wheelchair with [name], therapist providing counter assist. Worked on stand pivot transfers with pt verbalizing/directing wheelchair positioning, parts management, and positioning for PT. Min verbal cues needed for safety. Transfers currently min/mod assist. Performed standing balance activities to promote safety during functional mobility in standing 2 x > 3.5-4 min each tossing horseshoes, reaching within precautions, and tapping ball back and forth with [name] only - min assist overall to maintain balance.   Pt able to verbalize all precautions appropriately.   Therapy Documentation Precautions:  Precautions Precautions: Back;Fall (gentle elbow and wrist ROM [name]) Precaution Booklet Issued: No Precaution Comments: Patient able to state 3/3 back precautions Required Braces or Orthoses: Other Brace/Splint Spinal Brace: Thoracolumbosacral orthotic;Applied in sitting position Other Brace/Splint: Elbow splint to be worn at all times except with dressing changes.  Elbow at 75-80 degrees and wrist in neutral. Restrictions Weight Bearing Restrictions: Yes [name] Weight Bearing: Non weight bearing [name] Weight Bearing: Partial weight bearing [name] Partial Weight Bearing Percentage or Pounds: 50%  Pain: Pain Assessment Pain Assessment: No/denies pain Pain Score: 0-No pain   See FIM for current functional status  Therapy/Group: Individual Therapy  [name] 04/29/2012, 12:21 PM

## 2012-04-29 NOTE — Progress Notes (Signed)
Orthopedic Tech Progress Note Patient Details:  Ana [name] 1980-09-28 [id number]  Ortho Devices Type of Ortho Device: Arm foam sling Ortho Device/Splint Interventions: Application   Cammer, Mickie Bail 04/29/2012, 11:24 AM

## 2012-04-29 NOTE — Progress Notes (Signed)
Occupational Therapy Session Note  Patient Details  Name: Nikcole Eischeid MRN: [id number] Date of Birth: February 19, 1981  Today's Date: 04/29/2012 Time: 9629-5284 Time Calculation (min): 45 min   Skilled Therapeutic Interventions/Progress Updates:    Pt engaged in standing activities using hemi-walker with focus on standing balance.  Pt practiced stand pivot transfers w/c<>bed X 3 in ADL apartment.  Pt required min A with transfer to Rt and mod A with transfer to Lt.  Pt commented that transfer was more difficult on carpeting and when standing from bed at lower height.  Pt transitioned to gym to practice stand pivot transfers w/c<>mat at height approximately same height as bed at home.  Pt practiced 2 transfers.  Pt required min A for sit<>stand without hemi-walker. Therapy Documentation Precautions:  Precautions Precautions: Back;Fall (gentle elbow and wrist ROM [name]) Precaution Booklet Issued: No Precaution Comments: Patient able to state 3/3 back precautions Required Braces or Orthoses: Other Brace/Splint Spinal Brace: Thoracolumbosacral orthotic;Applied in sitting position Other Brace/Splint: Elbow splint to be worn at all times except with dressing changes.  Elbow at 75-80 degrees and wrist in neutral. Restrictions Weight Bearing Restrictions: Yes [name] Weight Bearing: Non weight bearing [name] Weight Bearing: Partial weight bearing [name] Partial Weight Bearing Percentage or Pounds: 50% Other Position/Activity Restrictions: HOB can be up 30 degrees without brace on   Pain: Pain Assessment Pain Assessment: 0-10 Pain Score:   5 Pain Type: Acute pain;Surgical pain Pain Location: Back Pain Orientation: Lower Pain Descriptors: Aching;Sore Pain Frequency: Occasional Pain Onset: Gradual Patients Stated Pain Goal: 2 Pain Intervention(s): RN aware; repositioned  See FIM for current functional status  Therapy/Group: Individual Therapy  Rich Brave 04/29/2012, 1:52 PM

## 2012-04-30 ENCOUNTER — Inpatient Hospital Stay (HOSPITAL_COMMUNITY): Payer: Private Health Insurance - Indemnity | Admitting: Physical Therapy

## 2012-04-30 ENCOUNTER — Inpatient Hospital Stay (HOSPITAL_COMMUNITY): Payer: 59

## 2012-04-30 ENCOUNTER — Inpatient Hospital Stay (HOSPITAL_COMMUNITY): Payer: 59 | Admitting: Occupational Therapy

## 2012-04-30 LAB — CULTURE, BLOOD (ROUTINE X 2): Culture: NO GROWTH

## 2012-04-30 NOTE — Progress Notes (Signed)
Occupational Therapy Note  Patient Details  Name: Ana [name] MRN: [id number] Date of Birth: 1981-05-07 Today's Date: 04/30/2012  Pt practiced stand pivot transfers w<>bed X 3 with min A.  Pt practiced supine<>sit from EOB X 3 with supervision.  Pt directed w/c setup correctly in preparation for transfers.  Pt transitioned to gym for standing activity while performing task with RUE.  Pt sustained standing balance with steady assist for 3 min and 2 min.  Pt returned to room and remained sitting in w/c with call bell and phone at side.   Lavone Neri [facility] 04/30/2012, 2:05 PM

## 2012-04-30 NOTE — Progress Notes (Signed)
04/30/2012 1:03 PM  Ana [name], Ana [name] [id number]  Post-Op Day 9    Temp:  [98.7 F (37.1 C)-98.8 F (37.1 C)] 98.7 F (37.1 C) (08/16 0500) Pulse Rate:  [88-100] 100  (08/16 0500) Resp:  [18-19] 19  (08/16 0500) BP: (116-125)/(81-85) 125/85 mmHg (08/16 0500) SpO2:  [95 %-97 %] 96 % (08/16 0955) Weight:  [69.4 kg (153 lb)] 69.4 kg (153 lb) (08/16 0500),     Intake/Output Summary (Last 24 hours) at 04/30/12 1303 Last data filed at 04/30/12 1300  Gross per 24 hour  Intake   1080 ml  Output      0 ml  Net   1080 ml    No results found for this or any previous visit (from the past 24 hour(s)).  SUBJECTIVE:  No nasal pain.  Breathing well  OBJECTIVE:  Splint removed. Nose straight.  Breathing well.   IMPRESSION:  Satisfactory check closed reduction nasal fx.  PLAN:  Outpatient f/u Dr. Lelon Perla.  No recheck with me necessary.  Avoid trauma to nose for 6 weeks.  Flo Shanks

## 2012-04-30 NOTE — Progress Notes (Signed)
Occupational Therapy Session Note  Patient Details  Name: Ana [name] MRN: [id number] Date of Birth: [birth year]  Today's Date: 04/30/2012 Time: 0900-1000 Time Calculation (min): 60 min  Short Term Goals=Long Term Goals  Skilled Therapeutic Interventions:  Patient in bed with TLSO on stating that she just finished her PT, just took pain medication and was resting because her back hurt.  Patient agreeable to participate in self care session.  Patient with reporting distal discomfort with elbow splint.  Splint was removed, adjustment made, and patient reports no more discomfort.  PA, Rinaldo Cloud perform dressing change on left elbow.  Self care retraining to include sit EOB and TLSO removed for UB bath and dress, donned TLSO for stand and wash perineal area.  Patient requested TLSO off and lay back down.  Left patient in room with RN performing dressing change on patient's back.  Therapy Documentation Precautions:  Precautions Precautions: Back;Fall (gentle elbow and wrist ROM [name]) Precaution Booklet Issued: No Precaution Comments: Patient able to state 3/3 back precautions Required Braces or Orthoses: Other Brace/Splint Spinal Brace: Thoracolumbosacral orthotic;Applied in sitting position Other Brace/Splint: Elbow splint to be worn at all times except with dressing changes.  Elbow at 75-80 degrees and wrist in neutral. Restrictions Weight Bearing Restrictions: Yes [name] Weight Bearing: Non weight bearing [name] Weight Bearing: Partial weight bearing [name] Partial Weight Bearing Percentage or Pounds: 50% Other Position/Activity Restrictions: HOB can be up 30 degrees without brace on Pain: C/o low back pain, not rated, premedicated.  Adjusted method for mobility during ADL.  See FIM for current functional status  Therapy/Group: Individual Therapy  [name] 04/30/2012, 3:37 PM

## 2012-04-30 NOTE — Progress Notes (Signed)
[facility] Individual Statement of Services  Patient Name:  Ana [name]  Date:  04/30/2012  Welcome to the [facility].  Our goal is to provide you with an individualized program based on your diagnosis and situation, designed to meet your specific needs.  With this comprehensive rehabilitation program, you will be expected to participate in at least 3 hours of rehabilitation therapies Monday-Friday, with modified therapy programming on the weekends.  Your rehabilitation program will include the following services:  Physical Therapy (PT), Occupational Therapy (OT), Speech Therapy (ST), 24 hour per day rehabilitation nursing, Therapeutic Recreaction (TR), Neuropsychology, Case Management ( Social Worker), Rehabilitation Medicine, Nutrition Services and Pharmacy Services  Weekly team conferences will be held on Tuesdays to discuss your progress.  Your  Social Worker will talk with you frequently to get your input and to update you on team discussions.  Team conferences with you and your family in attendance may also be held.  Expected length of stay: 10-12 days  Overall anticipated outcome: supervision to minimal assistance  Depending on your progress and recovery, your program may change.  Your  Social Worker will coordinate services and will keep you informed of any changes.  Your  Social Worker's name and contact numbers are listed  below.  The following services may also be recommended but are not provided by the [facility]:   Driving Evaluations  Home Health Rehabiltiation Services  [facility]  Vocational Rehabilitation   Arrangements will be made to provide these services after discharge if needed.  Arrangements include referral to agencies that provide these services.  Your insurance has been verified to be:  Community education officer Your primary doctor is:  None (SW will work with you to establish a primary  MD  Pertinent information will be shared with your doctor and your insurance company.    Social Worker:  [place], [place] [phone] or ([phone]  Information discussed with and copy given to patient by: Amada Jupiter, 04/30/2012, 1:27 PM

## 2012-04-30 NOTE — Progress Notes (Signed)
Physical Therapy Session Note  Patient Details  Name: Lexiana Spindel MRN: [id number] Date of Birth: [birth year]  Today's Date: 04/30/2012 Time: 4098-1191 Time Calculation (min): 44 min  Short Term Goals: Week 1:  PT Short Term Goal 1 (Week 1): Same as LTGs  Skilled Therapeutic Interventions/Progress Updates:    Practiced car transfer performed with intermittent min assist, verbal cues for safety and sequence. Stand pivot transfers to low surface performed with stabilizing assist. Retrieved 18" x 18" wheelchair with basic cushion and back for trail. Pt to determine if this chair is comfortable enough to tolerate at home. Rt. LE long arc quads performed for strengthening, 3 x 10 reps.   Therapy Documentation Precautions:  Precautions Precautions: Back;Fall (gentle elbow and wrist ROM [name]) Precaution Booklet Issued: No Precaution Comments: Patient able to state 3/3 back precautions Required Braces or Orthoses: Other Brace/Splint Spinal Brace: Thoracolumbosacral orthotic;Applied in sitting position Other Brace/Splint: Elbow splint to be worn at all times except with dressing changes.  Elbow at 75-80 degrees and wrist in neutral. Restrictions Weight Bearing Restrictions: Yes [name] Weight Bearing: Non weight bearing [name] Weight Bearing: Partial weight bearing [name] Partial Weight Bearing Percentage or Pounds: 50% Other Position/Activity Restrictions: HOB can be up 30 degrees without brace on Pain:  5/10 low back at end of treatment, RN made aware of need for pain medication  See FIM for current functional status  Therapy/Group: Individual Therapy  [name] 04/30/2012, 12:28 PM

## 2012-04-30 NOTE — Progress Notes (Signed)
Physical Therapy Session Note  Patient Details  Name: Ana [name] MRN: [id number] Date of Birth: 1981/03/28  Today's Date: 04/30/2012 Time: 9147-8295 Time Calculation (min): 43 min  Short Term Goals: Week 1:  PT Short Term Goal 1 (Week 1): Same as LTGs  Skilled Therapeutic Interventions/Progress Updates:    Pt reports trial of standard wheelchair feels fine with no increased pain. Took pt outside for emotional wellbeing and motivation. Performed standing balance activities hitting beach ball back/forth to mother at varying speeds with [name], (no UE support). Up to min assist for balance. Pt maintains precautions well. Wheelchair mobility with [name] only x 150' with moderate assist. Sit <> stand with supervision, static/ dynamic balance practiced while purchasing subway cookie.   Spoke with Child psychotherapist, to try to have pt's wheelchair (with decreased height) by Monday. Pt will hopefully be able to utilize Lt. LE to assist with propulsion at this point.  Therapy Documentation Precautions:  Precautions Precautions: Back;Fall (gentle elbow and wrist ROM [name]) Precaution Booklet Issued: No Precaution Comments: Patient able to state 3/3 back precautions Required Braces or Orthoses: Other Brace/Splint Spinal Brace: Thoracolumbosacral orthotic;Applied in sitting position Other Brace/Splint: Elbow splint to be worn at all times except with dressing changes.  Elbow at 75-80 degrees and wrist in neutral. Restrictions Weight Bearing Restrictions: Yes [name] Weight Bearing: Non weight bearing [name] Weight Bearing: Partial weight bearing [name] Partial Weight Bearing Percentage or Pounds: 50% Other Position/Activity Restrictions: HOB can be up 30 degrees without brace on Pain: Pain Assessment Pain Assessment: 0-10 Pain Score:   4 Pain Type: Acute pain Pain Location: Back Pain Orientation: Lower Pain Descriptors: Aching Pain Frequency: Intermittent Pain Onset: Gradual Patients Stated Pain  Goal: 3 Pain Intervention(s): RN made aware. Repositioned Multiple Pain Sites: No  See FIM for current functional status  Therapy/Group: Individual Therapy  [name] 04/30/2012, 5:13 PM

## 2012-04-30 NOTE — Progress Notes (Signed)
Nursing Note: Pt complained of room being too warm. Instructed in report to monitor if room was comfortable as room had been serviced earlier because it was too hot.Pt and belongings moved to room 4144 without incident.Pt states she is much more comfortable a this room is more cool.wbb
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
## 2012-05-01 ENCOUNTER — Inpatient Hospital Stay (HOSPITAL_COMMUNITY): Payer: 59 | Admitting: *Deleted

## 2012-05-01 ENCOUNTER — Inpatient Hospital Stay (HOSPITAL_COMMUNITY): Payer: 59 | Admitting: Physical Therapy

## 2012-05-01 MED ORDER — POLYETHYLENE GLYCOL 3350 17 G PO PACK
17.0000 g | PACK | Freq: Every day | ORAL | Status: DC
  Administered 2012-05-02: 17 g via ORAL
  Filled 2012-05-01 (×3): qty 1

## 2012-05-01 MED ORDER — IPRATROPIUM-ALBUTEROL 18-103 MCG/ACT IN AERO: 2.0000 | INHALATION_SPRAY | Freq: Four times a day (QID) | RESPIRATORY_TRACT | Status: DC | PRN

## 2012-05-01 NOTE — Progress Notes (Signed)
Physical Therapy Session Note  Patient Details  Name: Ana [name] MRN: [id number] Date of Birth: 1980-12-06  Today's Date: 05/01/2012 Time:0804-0904, [id number] Time Calculation (min): 40 min  Short Term Goals: Week 1:  PT Short Term Goal 1 (Week 1): Same as LTGs  Skilled Therapeutic Interventions/Progress Updates:   Therapeutic Activity: 1. Trialed axillary crutches vs. SBQC for support with stand-pivot transfers. Pt preferred SBQC. Progressed from MinA to S. Multple reps and from various functional surfaces. 2. Bed mobility: worked on technique esp. Leg management. 3. Standing balance/ endurance with multilevel reaching with intermittant UE support, S. Therapeutic Exercise: Supine/ seated LE strengthening 2-3# weights on LLE, AA/ROM [name] with SLR, AROM- SAQ,LAQ, Mini/Modified bridging, Hip adduction squeezes, 10-20 x ea.  Therapy Documentation Precautions:  Precautions Precautions: Back;Fall (gentle elbow and wrist ROM [name]) Precaution Booklet Issued: No Precaution Comments: Patient able to state 3/3 back precautions Required Braces or Orthoses: Other Brace/Splint Spinal Brace: Thoracolumbosacral orthotic;Applied in sitting position Other Brace/Splint: Elbow splint to be worn at all times except with dressing changes.  Elbow at 75-80 degrees and wrist in neutral. Restrictions Weight Bearing Restrictions: Yes [name] Weight Bearing: Non weight bearing [name] Weight Bearing: Partial weight bearing [name] Partial Weight Bearing Percentage or Pounds:  (50%) Other Position/Activity Restrictions: HOB can be up 30 degrees without brace on General:   Vital Signs: Therapy Vitals Temp: 98.2 F (36.8 C) Temp src: Oral Pulse Rate: 98  Resp: 18  BP: 114/77 mmHg Patient Position, if appropriate: Lying Oxygen Therapy SpO2: 99 % O2 Device: None (Room air) Pain: Pain Assessment Pain Score: 0-No pain, premedicated  See FIM for current functional status  Therapy/Group: Individual  Therapy   Hortencia Conradi, PTA 05/01/2012, 3:49 PM

## 2012-05-01 NOTE — Progress Notes (Signed)
OccupationalTherapy Note  Patient Details  Name: Ana [name] MRN: [id number] Date of Birth: 1980/10/31 Today's Date: 05/01/2012 Time:0900-0955  ( ) Pain:  2/10 Individual session 1st session Engaged in bathing and dressing at sink level.  Practiced sit to stand, standing balance and adaptive techniques.  Needed minimal assist while standing to wash peri area and cues for postural control.  Pt. Needed assist donning and Doffing TLSO.  Educated her on tightning the straps from the bottom to the top.  Pt. Left in wc at end of session  2nd session:  Time:  1515-1610  (55 min) Pain:  2/10 Individual session Engaged in cooking activity to address standing balance while using RUE.  Pt. Reached into cabinets to obtain items.  Pt. Recalled back precautions 3/3 correctly.  She stood for 3 minutes while cooking on stovetop.   She placed items back in cabinets.  Unable to roll wc forward but can do backwards.  Assisted rolling wc back to room.  Transferred from wc to bed going to right side with SBA using quad cane.  Needed mod assist to go from sitting to supine.   Humberto Seals 05/01/2012, 10:04 AM
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
## 2012-05-02 ENCOUNTER — Inpatient Hospital Stay (HOSPITAL_COMMUNITY): Payer: 59 | Admitting: Physical Therapy
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
## 2012-05-02 NOTE — Progress Notes (Signed)
Physical Therapy Note  Patient Details  Name: Ana [name] MRN: [id number] Date of Birth: Mar 18, 1981 Today's Date: 05/02/2012  Time: 900-954 54 minutes  No c/o pain.  Stand pivot transfer training with [facility] multiple reps to various chairs with supervision.  Pt reports comfort using this method to transfer.  Standing balance training with reaching, bending activity (within precautions) with supervision, pt able to maintain back precautions and wt bearing precautions without cues.  Supine therex for LE strengthening 2 x 15 reps AROM.  Pt with good motivation and activity tolerance.  Individual therapy   Morayma Godown 05/02/2012, 9:55 AM

## 2012-05-03 ENCOUNTER — Inpatient Hospital Stay (HOSPITAL_COMMUNITY): Payer: 59 | Admitting: Physical Therapy

## 2012-05-03 ENCOUNTER — Inpatient Hospital Stay (HOSPITAL_COMMUNITY): Payer: 59 | Admitting: Occupational Therapy

## 2012-05-03 ENCOUNTER — Inpatient Hospital Stay (HOSPITAL_COMMUNITY): Payer: 59

## 2012-05-03 DIAGNOSIS — S82409B Unspecified fracture of shaft of unspecified fibula, initial encounter for open fracture type I or II: Secondary | ICD-10-CM

## 2012-05-03 DIAGNOSIS — S32009A Unspecified fracture of unspecified lumbar vertebra, initial encounter for closed fracture: Secondary | ICD-10-CM

## 2012-05-03 DIAGNOSIS — S5290XA Unspecified fracture of unspecified forearm, initial encounter for closed fracture: Secondary | ICD-10-CM

## 2012-05-03 DIAGNOSIS — Z5189 Encounter for other specified aftercare: Secondary | ICD-10-CM

## 2012-05-03 DIAGNOSIS — S82209B Unspecified fracture of shaft of unspecified tibia, initial encounter for open fracture type I or II: Secondary | ICD-10-CM

## 2012-05-03 DIAGNOSIS — S069X9A Unspecified intracranial injury with loss of consciousness of unspecified duration, initial encounter: Secondary | ICD-10-CM

## 2012-05-03 MED ORDER — POLYETHYLENE GLYCOL 3350 17 G PO PACK
17.0000 g | PACK | Freq: Every day | ORAL | Status: DC | PRN
  Filled 2012-05-03: qty 1

## 2012-05-03 NOTE — Progress Notes (Signed)
Orthopedic Tech Progress Note Patient Details:  Ana [name] 1981-04-12 [id number]  Patient ID: Ana [name], female   DOB: 05-Mar-1981, 31 y.o.   MRN: [id number]   Ana [name] 05/03/2012, 11:01 AM Pt has sling in room
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
## 2012-05-03 NOTE — Progress Notes (Signed)
Occupational Therapy Session Note  Patient Details  Name: Ana [name] MRN: [id number] Date of Birth: June 23, 1981  Today's Date: 05/03/2012 Time: 8469-6295 Time Calculation (min): 70 min  Short Term Goals= Long Term Goals:  Skilled Therapeutic Interventions:  Patient on [facility] upon entry into room with 31 yo daughter also present.  Patient able to perform hygiene task for urine and patient's daughter practiced hygiene task after BM with verbal cues for completeness.  Patient performed bath and dress at sink with focus on sit><stand, maintain back precautions and PWB, use of AE to improve independence with LB dressing.  Patient practiced shoulder self ROM in seated position with c/o pain/stretch in elbow area.  Patient able to reach ~80 degrees shoulder flex with self ROM yet ~95 degrees with PROM before c/o pain/stretch in elbow area.  Spoke with Dr. Ronie Spies PA, [place] ~12:50 pm today to request an order be written to provide OT with parameters regarding [name] exercises.  Therapy Documentation Precautions:  Precautions Precautions: Back;Fall (gentle elbow and wrist ROM [name]) Precaution Booklet Issued: No Precaution Comments: Patient able to state 3/3 back precautions Required Braces or Orthoses: Other Brace/Splint Spinal Brace: Thoracolumbosacral orthotic;Applied in sitting position Other Brace/Splint: Elbow splint to be worn at all times except with dressing changes.  Elbow at 75-80 degrees and wrist in neutral. Restrictions Weight Bearing Restrictions: Yes [name] Weight Bearing: Non weight bearing [name] Weight Bearing: Partial weight bearing [name] Partial Weight Bearing Percentage or Pounds: 50 % Other Position/Activity Restrictions: HOB can be up 30 degrees without brace on Pain: 3/10 in back, premedicated  See FIM for current functional status  Therapy/Group: Individual Therapy  Anjulie Dipierro 05/03/2012, 3:09 PM

## 2012-05-03 NOTE — Progress Notes (Signed)
Recreational Therapy Session Note  Patient Details  Name: Lazaria Schaben MRN: [id number] Date of Birth: 1980/10/09 Today's Date: 05/03/2012  Pain: no c/o Skilled Therapeutic Interventions/Progress Updates: discussed potential community reintegration/outing to be scheduled this week including purpose of outing and potential goals.  Pt agreeable to participate.  Therapy/Group: Individual Therapy  Stepahnie Campo 05/03/2012, 4:27 PM

## 2012-05-03 NOTE — Progress Notes (Signed)
Occupational Therapy Note  Patient Details  Name: Ana [name] MRN: [id number] Date of Birth: Jan 30, 1981 Today's Date: 05/03/2012  Time: 1345-1415 Pt denies pain Individual therapy  Pt laying supine on mat at beginning of session.  Performed PROM Lt shoulder - flexion, abduction, adduction - to pt's tolerance.  Demonstrated self ROM for shoulder while laying in supine.  Pt returned demonstration.  Pt stated she could feel some stretching in elbow with shoulder flexion >90 degrees.  Pt sat EOM for continued PROM to shoulder - flexion, abduction, adduction.  Lavone Neri [facility] 05/03/2012, 2:47 PM

## 2012-05-03 NOTE — Progress Notes (Signed)
Staples x 3 removed from left forearm, patient tolerated well. Patient with multiple incisions along outer and inner aspect of left forearm, edges approximated, healing, small amount of drainage noted on old dressings, no odor noted, placed dry 4 x 4 gauzes, abdominal pads secured with Kerlix and paper tape. [place]on[place], Ana [name]

## 2012-05-03 NOTE — Progress Notes (Signed)
Physical Therapy Session Note  Patient Details  Name: Ana [name] MRN: [id number] Date of Birth: [birth year]  Today's Date: 05/03/2012 Time: 0730-0829 Time Calculation (min): 59 min  Short Term Goals: Week 1:  PT Short Term Goal 1 (Week 1): Same as LTGs  Skilled Therapeutic Interventions/Progress Updates:  Pain:  No c/o pain   - Car Transfer with quad cane, supervision. Educated pt's daughter on safety (wheelchair brakes, positioning) - Stand pivot transfers on carpet to variety of surfaces (bed, wheelchair, low couch) with quad cane, supervision. - Standing dynamic balance activities catch/toss ball with daughter ([name] only), and acting out activities named on ball within precautions up to min assist to correct balance. - Rt. Foot taps onto step for standing endurance, balance and strength up to min assist for balance.   Second Session Skilled Therapeutic Interventions/Progress Updates:  Time: 4098-1191   Time Calculation (min): 44 min Pain: 4/10 just premedicated Nustep 2 x 5 min, level 4 with [name], and Lt. LE only. Modified [place] exercise program initiated: Performed bil. LE long arc quads, standing Rt. LE hip abduction, Rt. Knee flexion, and Lt. Heel/toe rockers, Lt. LE mini squats 1 x 10 reps each. Supine Rt. LE exercises heel slides, hip abduction 1 x 10 reps. Pt given handouts of all exercises. Again discussed need for ramp, pt reports her step mother will be having the ramp built as soon as possible.  Therapy Documentation Precautions:  Precautions Precautions: Back;Fall (gentle elbow and wrist ROM [name]) Precaution Booklet Issued: No Precaution Comments: Patient able to state 3/3 back precautions Required Braces or Orthoses: Other Brace/Splint Spinal Brace: Thoracolumbosacral orthotic;Applied in sitting position Other Brace/Splint: Elbow splint to be worn at all times except with dressing changes.  Elbow at 75-80 degrees and wrist in neutral. Restrictions Weight  Bearing Restrictions: Yes [name] Weight Bearing: Non weight bearing [name] Weight Bearing: Partial weight bearing [name] Partial Weight Bearing Percentage or Pounds: 50 % Other Position/Activity Restrictions: HOB can be up 30 degrees without brace on   See FIM for current functional status  Therapy/Group: Individual Therapy both sessions [name] 05/03/2012, 12:23 PM

## 2012-05-04 ENCOUNTER — Inpatient Hospital Stay (HOSPITAL_COMMUNITY): Payer: 59

## 2012-05-04 ENCOUNTER — Inpatient Hospital Stay (HOSPITAL_COMMUNITY): Payer: 59 | Admitting: Physical Therapy

## 2012-05-04 ENCOUNTER — Inpatient Hospital Stay (HOSPITAL_COMMUNITY): Payer: 59 | Admitting: Occupational Therapy

## 2012-05-04 NOTE — Progress Notes (Signed)
Occupational Therapy Session Note  Patient Details  Name: Ana [name] MRN: [id number] Date of Birth: 1981-08-26  Today's Date: 05/04/2012 Time: 1110-1220 Time Calculation (min): 70 min  Short Term Goals=Long Term Goals  Skilled Therapeutic Interventions:  Self care retraining to include sponge bath and dressing at EOB and in standing with TLSO in place.  Focus session on use of toilet aid to bath her bottom and reacher for LB b/dsg, activity tolerance, maintain back precautions, NWB [name] and PWB of [name].  Patient's daughter Damian Leavell present to observe with opportunities to assist yet patient and daughter both report no further training needed and patient is independent guiding any caregiver when assistance is needed.  Reviewed the MD order for AROM/AAROM of [name] with plan for PM OT session to perform ROM exercises.  Therapy Documentation Precautions:  Precautions Precautions: Back;Fall (gentle elbow and wrist ROM [name]) Precaution Booklet Issued: No Precaution Comments: Patient able to state 3/3 back precautions Required Braces or Orthoses: Other Brace/Splint Spinal Brace: Thoracolumbosacral orthotic;Applied in sitting position Other Brace/Splint: Elbow splint to be worn at all times except with dressing changes.  Elbow at 75-80 degrees and wrist in neutral. Restrictions Weight Bearing Restrictions: Yes [name] Weight Bearing: Non weight bearing [name] Weight Bearing: Partial weight bearing [name] Partial Weight Bearing Percentage or Pounds: 50% Other Position/Activity Restrictions: HOB can be up 30 degrees without brace on Pain: 3/10 in back, premedicated, repositioned  See FIM for current functional status  Therapy/Group: Individual Therapy  Arjay Jaskiewicz 05/04/2012, 12:34 PM
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
## 2012-05-04 NOTE — Progress Notes (Signed)
Occupational Therapy Note  Patient Details  Name: Ana [name] MRN: [id number] Date of Birth: 10-24-1980 Today's Date: 05/04/2012  Time: 6962-9528 Pt denies pain Individual therapy Pt in w/c with daughter in room.  Issued Rocker Knife and demonstrated use and pt return demonstrated.  Transitioned to pt washer room to launder clothing and demonstrated use of washer/dryer.  Washer in use and pt stated she and daughter would check later to wash clothes.  Instructed pt to ask any staff if she had any questions.  Returned to room for [facility] for left elbow (flexion, extension, pronation, and supination) and flexion and extension of left wrist per MD orders.  Pt tolerated well stating wrist flexion was most uncomfortable.  Elbow ROM = ~75-160 degrees; wrist extension ~10 degrees past neutral and wrist flexion ~20 degrees past neutral; pronation at full ROM; limited supination.   Lavone Neri [facility] 05/04/2012, 2:23 PM

## 2012-05-04 NOTE — Progress Notes (Signed)
Physical Therapy Session Note  Patient Details  Name: Ana [name] MRN: [id number] Date of Birth: 1981/07/02  Today's Date: 05/04/2012 Time: 0730-0830 Time Calculation (min): 60 min  Short Term Goals: Week 1:  PT Short Term Goal 1 (Week 1): Same as LTGs  Skilled Therapeutic Interventions/Progress Updates:    Pain: 3/10 Pt taken to solarium for transfer training to variety of surfaces at a variety of heights over carpeted and tile surfaces. Pt overall supervision level. Rt. LE supine exercises from HEP: Toe flexion/extension, short kicks, heel slides, hip abduction/adduction 2 x 10 reps. Pt reports ramp building to hopefully start today.  Second Session Skilled Therapeutic Interventions/Progress Updates:  Time:  1300-1330 Time Calculation (min): 30 min Pain: 0/10 Discussed D/C planning with pt. Standing balance activity tossing charades ball with daughter while on compliant foam mat with min-guard assist. Completed standing modified [place] exercise program, each exercise 1 x 10 reps each.   **Post session pt's PA reporting pt is to have soft cast removed, may be appropriate knee walker trial tomorrow.  Therapy Documentation Precautions:  Precautions Precautions: Back;Fall (gentle elbow and wrist ROM [name]) Precaution Booklet Issued: No Precaution Comments: Patient able to state 3/3 back precautions Required Braces or Orthoses: Other Brace/Splint Spinal Brace: Thoracolumbosacral orthotic;Applied in sitting position Other Brace/Splint: Elbow splint to be worn at all times except with dressing changes.  Elbow at 75-80 degrees and wrist in neutral. Restrictions Weight Bearing Restrictions: Yes [name] Weight Bearing: Non weight bearing [name] Weight Bearing: Partial weight bearing [name] Partial Weight Bearing Percentage or Pounds: 50% Other Position/Activity Restrictions: HOB can be up 30 degrees without brace on  See FIM for current functional status  Therapy/Group: Individual  Therapy  [name] 05/04/2012, 10:13 AM

## 2012-05-04 NOTE — Progress Notes (Signed)
Order from Marissa Nestle PA to remove soft cast to right leg after therapy, RN attempted to remove soft cast, unable due to inability to remove the hard part of the cast. Ortho tech paged at 1830 to see able to help with removal of soft cast, ace wrap applied over soft cast.

## 2012-05-05 ENCOUNTER — Inpatient Hospital Stay (HOSPITAL_COMMUNITY): Payer: 59 | Admitting: Physical Therapy

## 2012-05-05 ENCOUNTER — Inpatient Hospital Stay (HOSPITAL_COMMUNITY): Payer: 59 | Admitting: Occupational Therapy

## 2012-05-05 MED ORDER — ENSURE COMPLETE PO LIQD
237.0000 mL | Freq: Two times a day (BID) | ORAL | Status: DC
  Administered 2012-05-05 – 2012-05-06 (×2): 237 mL via ORAL

## 2012-05-05 MED ORDER — METHOCARBAMOL 750 MG PO TABS
750.0000 mg | ORAL_TABLET | Freq: Four times a day (QID) | ORAL | Status: DC
  Administered 2012-05-05 – 2012-05-06 (×3): 750 mg via ORAL
  Filled 2012-05-05 (×6): qty 1

## 2012-05-05 MED ORDER — TRAMADOL HCL 50 MG PO TABS
50.0000 mg | ORAL_TABLET | Freq: Four times a day (QID) | ORAL | Status: DC
  Administered 2012-05-05 – 2012-05-06 (×4): 50 mg via ORAL
  Filled 2012-05-05 (×6): qty 1

## 2012-05-05 NOTE — Progress Notes (Signed)
Recreational Therapy Session Note  Patient Details  Name: Latoya Maulding MRN: [id number] Date of Birth: December 01, 1980 Today's Date: 05/05/2012 Time: 1030-1300 Pain: 3/10 [name], premedicated, repositioning Skilled Therapeutic Interventions/Progress Updates: Pt participated in community reintegration/outing to [employer] w/c level.   Pt required Total assist to maneuver w/c on community surfaces due to inclines, tight spaces, and negotiation of obstacles.  Pt performed stand pivot transfers throughout outing using [facility] with close supervision.  Discussion focused on identification and negotiation of obstacles and energy conservation techniques.  Therapy/Group: [employer]  Mariena Meares 05/05/2012, 4:48 PM

## 2012-05-05 NOTE — Progress Notes (Signed)
Recreational Therapy Discharge Summary Patient Details  Name: Ana [name] MRN: [id number] Date of Birth: 12/20/1980 Today's Date: 05/05/2012  Long term goals set: 1  Long term goals met: 0  Comments on progress toward goals: Pt has made excellent progress during LOS although did not meet Mod assist goal for community reintegration w/c level.  Pt is limited by BLE WBing and [name] restrictions during community reintegration and required Total assist for w/c negotiation over inclines and negotiation of obstacles.  Pt is able to recognize obstacles and problem solve negotiation of them, but requires physical assist to do so. Pt performed stand pivot transfers safely using [employer] with close supervision throughout outing.  Pt is ready for discharge home with family to provide supervision/assist.   Reasons for discharge: discharge from hospital  Patient/family agrees with progress made and goals achieved: Yes  Libbi Towner 05/05/2012, 5:33 PM
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
## 2012-05-05 NOTE — Progress Notes (Signed)
Physical Therapy Session Note  Patient Details  Name: Ana [name] MRN: [id number] Date of Birth: [birth year]  Today's Date: 05/05/2012 Time: 1030-1300 Time Calculation (min): 150 min,Charged 120 min  Second Treatment:  14:00-14:28 Time:  28 min  Short Term Goals: Week 1:  PT Short Term Goal 1 (Week 1): Same as LTGs  Skilled Therapeutic Interventions/Progress Updates:    Pt participated in community outing to go to lunch.  Pt performed all transfers on van and in restaurant with close supervision using SBQC.  W/c propulsion was performed with total @ of staff due to inclines and difficulty with negotiating obstacles in the restaurant.  Pt was able to identify obstacles in the community to her mobility and problem solve how to manage.   Therapy Documentation Precautions:  Precautions Precautions: Back;Fall (gentle elbow and wrist ROM [name]) Precaution Booklet Issued: No Precaution Comments: Patient able to state 3/3 back precautions Required Braces or Orthoses: Other Brace/Splint Spinal Brace: Thoracolumbosacral orthotic;Applied in sitting position Other Brace/Splint: Elbow splint to be worn at all times except with dressing changes.  Elbow at 75-80 degrees and wrist in neutral. Restrictions Weight Bearing Restrictions: Yes [name] Weight Bearing: Non weight bearing [name] Weight Bearing: Partial weight bearing [name] Partial Weight Bearing Percentage or Pounds: 50% Other Position/Activity Restrictions: HOB can be up 30 degrees without brace on Pain: Pain Assessment Pain Assessment: 0-10 Pain Score:   3 Pain Type: Acute pain Pain Location: Leg Pain Orientation: Right;Lower Pain Descriptors: Aching Pain Frequency: Intermittent Pain Onset: Gradual Pain Intervention(s): Medication (See eMAR);Repositioned Multiple Pain Sites: No Mobility:  Second Treatment:  W/c propulsion on unit with [name] and [name], x 150' with decreased speed.  W/c cushion removed to allow pt to get her foot on the  floor.  Transferred OOB with close supervision. Locomotion :  Second Treatment:  Attempted use of knee walker, but still to painful for pt to bear weight through her knee.   See FIM for current functional status  Therapy/Group: Co-Treatment with TR for community reintegration.   [name] 05/05/2012, 1:54 PM

## 2012-05-05 NOTE — Progress Notes (Signed)
Occupational Therapy Session Notes  Patient Details  Name: Ana [name] MRN: [id number] Date of Birth: 08-May-1981  Today's Date: 05/05/2012 Time: 0905-1015 and 2:30-2:58 Time Calculation (min): 70 min and 28 min  Short Term Goals=Long Term Goals  Skilled Therapeutic Interventions:  1)  Self care retraining to include shower, dress and groom.  Focus session on stand pivot transfer bed>w/c><3 in 1 in the shower, use of AE, maintaining all precautions while in shower and with dressing.  Patient very pleased to get her first shower today!  Elbow splint and straps washed with soap and water.  New straps and stockinet provided for elbow splint.  Patient reports comfortable and no issues.   2)  Reviewed and demonstrated options for using the 3 in 1 commode in the shower stall at home.  Patient unsure if the w/c will fit in the bathroom and up next to the shower.  W/c>bed with supervision, Mod I sit>supine, then focus session on A/AAROM exercises of [name] in supine.  SROM shoulder flexion ~90 degrees with elbow splint on, PROM by this clinician without splint on=shoulder flexion was full ROM!  SROM horizontal ab & adduction WFL, AAROM for shoulder ab & adduction, external & internal rotation, elbow flex & ext, forearm supination & pronation, wrist and hand flex and extension.  Patient's ROM limited by pain for all movements except shoulder internal rotation and horizontal ab & adduction.  Therapy Documentation Precautions:  Precautions Precautions: Back;Fall (gentle elbow and wrist ROM [name]) Precaution Booklet Issued: No Precaution Comments: Patient able to state 3/3 back precautions Required Braces or Orthoses: Other Brace/Splint Spinal Brace: Thoracolumbosacral orthotic;Applied in sitting position Other Brace/Splint: Elbow splint to be worn at all times except with dressing changes.  Elbow at 75-80 degrees and wrist in neutral. Restrictions Weight Bearing Restrictions: Yes [name] Weight Bearing: Non  weight bearing [name] Weight Bearing: Partial weight bearing [name] Partial Weight Bearing Percentage or Pounds:  (50%) Other Position/Activity Restrictions: HOB can be up 30 degrees without brace on Pain: 2/10 in back, premedicated, [name] occasional pain with A/AAROM  See FIM for current functional status  Therapy/Group: Individual Therapy  Nima Kemppainen 05/05/2012, 11:00 AM

## 2012-05-05 NOTE — Progress Notes (Signed)
Nutrition Follow-up  Intervention:  Will change Resource Breeze po BID to Ensure Complete po BID, per pt preference. Provided Ensure Complete coupons and discussed need for protein intake when d/c. Pt verbalized understanding.  Assessment:   Continues to eat well, consuming 75 - 100% of meals. Pt planning to d/c home tomorrow.  Diet Order:  Regular Supplement: Resource Breeze po BID, each supplement provides 250 kcal and 9 grams of protein.  Meds: Scheduled Meds:   . antiseptic oral rinse  15 mL Mouth Rinse BID  . docusate sodium  200 mg Oral BID  . enoxaparin (LOVENOX) injection  40 mg Subcutaneous Q24H  . feeding supplement  1 Container Oral BID BM  . methocarbamol  1,000 mg Oral QID  . multivitamin with minerals  1 tablet Oral Daily  . pantoprazole  40 mg Oral Q1200  . traMADol  100 mg Oral Q6H   Continuous Infusions:  PRN Meds:.acetaminophen, albuterol-ipratropium, alum & mag hydroxide-simeth, bisacodyl, diphenhydrAMINE, guaiFENesin-dextromethorphan, loratadine, menthol-cetylpyridinium, ondansetron (ZOFRAN) IV, ondansetron, oxyCODONE, phenol, polyethylene glycol, polyvinyl alcohol, traZODone  Labs:  CMP     Component Value Date/Time   NA 138 04/28/2012 0700   K 3.8 04/28/2012 0700   CL 102 04/28/2012 0700   CO2 27 04/28/2012 0700   GLUCOSE 106* 04/28/2012 0700   BUN 9 04/28/2012 0700   CREATININE 0.44* 04/28/2012 0700   CALCIUM 9.3 04/28/2012 0700   PROT 7.3 04/28/2012 0700   ALBUMIN 2.8* 04/28/2012 0700   AST 38* 04/28/2012 0700   ALT 53* 04/28/2012 0700   ALKPHOS 116 04/28/2012 0700   BILITOT 0.9 04/28/2012 0700   GFRNONAA >90 04/28/2012 0700   GFRAA >90 04/28/2012 0700     Intake/Output Summary (Last 24 hours) at 05/05/12 1255 Last data filed at 05/05/12 0800  Gross per 24 hour  Intake    480 ml  Output      0 ml  Net    480 ml  BM 8/18  Weight Status:  148 lb/67.3 kg - wt down 7 lb x 1 week  Estimated needs:  2050 - 2250 kcal, 105 - 120 grams protein  Nutrition Dx:   Increased nutrient needs r/t TBI and participation in therapies AEB estimated needs.  Goal: Pt to meet >/= 90% of their estimated nutrition needs;  met  Monitor:  PO intake, weights, labs, I/O's  Jarold Motto MS, RD, LDN Pager: [phone] After-hours pager: [phone]

## 2012-05-05 NOTE — Progress Notes (Signed)
Social Work Patient ID: Joselyn Arrow, female   DOB: [birth year], 31 y.o.   MRN: [id number]  Met yesterday afternoon and spoke with her again today to review team conference and discuss d/c plans.  Pt plans to d/c initially to her father's home where she will have 24/7 assistance since her mother has not yet returned to [facility].  Pt questions if her mother will return at all.  I did speak with her mother this morning and informed her of d/c 8/22 and she said that she does plan on returning, however, not sure how quickly.  She is going to speak with patient about her plans.  Team recommends to hold on any f/u PT until WB allowed.  Pt to follow up with Dr. Mina Marble next week to determine if he wants her to complete OT as an outpatient at his office.  DME on order.  Demaurion Dicioccio
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
## 2012-05-06 MED ORDER — TRAMADOL HCL 50 MG PO TABS: 100.0000 mg | ORAL_TABLET | Freq: Four times a day (QID) | ORAL | Status: AC

## 2012-05-06 MED ORDER — TRAMADOL HCL 50 MG PO TABS: 100.0000 mg | ORAL_TABLET | Freq: Four times a day (QID) | ORAL | Status: DC

## 2012-05-06 MED ORDER — TRAMADOL HCL 50 MG PO TABS
50.0000 mg | ORAL_TABLET | Freq: Once | ORAL | Status: AC
  Administered 2012-05-06: 50 mg via ORAL

## 2012-05-06 MED ORDER — OXYCODONE HCL 5 MG PO TABS: 5.0000 mg | ORAL_TABLET | ORAL | Status: AC | PRN

## 2012-05-06 MED ORDER — METHOCARBAMOL 500 MG PO TABS: 500.0000 mg | ORAL_TABLET | Freq: Four times a day (QID) | ORAL | Status: AC

## 2012-05-06 MED ORDER — DSS 100 MG PO CAPS: 200.0000 mg | ORAL_CAPSULE | Freq: Two times a day (BID) | ORAL | Status: AC

## 2012-05-06 NOTE — Progress Notes (Signed)
Patient and family received written and verbal discharge instructions, they are aware of follow appointments and weight bearing restrictions for Left arm and Right lower leg,back precautions, TLSO brace on when out of bed,may put on at edge of bed, use of left arm splint , incision care, denies any questions or concerns,patient belongings packed by family,patient taken down by NT via wheelchair to private vehicle. [place], Kwynn Schlotter Elon Jester

## 2012-05-06 NOTE — Progress Notes (Signed)
Social Work  Discharge Note  The overall goal for the admission was met for:   Discharge location: Yes - home with father and step-mother  Length of Stay: Yes - 9 days  Discharge activity level: Yes - supervision to some minimal assistance  Home/community participation: Yes  Services provided included: MD, RD, PT, OT, SLP, RN, CM, TR, Pharmacy, Neuropsych and SW  Financial Services: Private Insurance: [employer]  Follow-up services arranged: Other: No PT f/u recommended until allowed to [facility]; to f/u with Dr. Mina Marble in regards to OPOT f/u  Comments (or additional information):  Patient/Family verbalized understanding of follow-up arrangements: Yes  Individual responsible for coordination of the follow-up plan: patient  Confirmed correct DME delivered: Ana [name] 05/06/2012    [name]

## 2012-05-06 NOTE — Progress Notes (Signed)
Patient ID: Ana [name], female   DOB: 14-Sep-1981, 31 y.o.   MRN: [id number] Subjective:    s/p ORIF right open tibia fracture    Patient reports pain as mild with regards to leg more pain in back  Objective:   VITALS:   Filed Vitals:   05/06/12 0500  BP: 113/75  Pulse: 87  Temp: 98.4 F (36.9 C)  Resp: 19    Neurovascular intact Incision: scant drainage over lower leg at open site, no signs of infection  LABS No results found for this basename: HGB:3,HCT:3,WBC:3,PLT:3 in the last 72 hours  No results found for this basename: NA:3,K:3,BUN:3,CREATININE:3,GLUCOSE:3 in the last 72 hours  No results found for this basename: LABPT:2,INR:2 in the last 72 hours   Assessment/Plan:       Discharge home with home health F/u in 2 weeks May shower with out covering wounds PWB [name], 50%

## 2012-05-06 NOTE — Progress Notes (Signed)
Physical Therapy Discharge Summary  Patient Details  Name: Ana [name] MRN: [id number] Date of Birth: 1981/07/21  Today's Date: 05/06/2012    Patient has met 6 of 6 long term goals due to improved activity tolerance, improved balance, increased strength, decreased pain and ability to compensate for deficits.  Patient to discharge at a wheelchair level Supervision.   Patient's care partner is independent to provide the necessary physical assistance at discharge.  Reasons goals not met: Goals met  Recommendation:  Patient will benefit from ongoing skilled PT services in home health setting to continue to advance safe functional mobility, address ongoing impairments in strength and balance and gait when weight bearing status changes and minimize fall risk.  Equipment: w/c, cushion, SBQC  Reasons for discharge: treatment goals met  Patient/family agrees with progress made and goals achieved: Yes  PT Discharge Precautions/Restrictions  Non wt bearing [name], PWB [name]  Pain Pain Assessment Pain Score: 0-No pain Sensation Sensation Light Touch: Appears Intact Mobility Bed Mobility Bed Mobility: Rolling Right Rolling Right: 6: Modified independent (Device/Increase time) Sitting - Scoot to Edge of Bed: 6: Modified independent (Device/Increase time) Sit to Sidelying Right: 6: Modified independent (Device/Increase time) Transfers Sit to Stand: 5: Supervision Stand to Sit: 5: Supervision Locomotion  Ambulation Ambulation/Gait Assistance: Not tested (comment) (unable at this time due to wt bearing status) Stairs / Additional Locomotion Ramp: Not tested (comment) (unable due to wt bearing status) Wheelchair Mobility Wheelchair Mobility: Yes Wheelchair Assistance: 5: Supervision Wheelchair Propulsion: Right upper extremity;Left lower extremity Wheelchair Parts Management: Needs assistance Distance: 150  Balance Static Sitting Balance Static Sitting - Level of Assistance: 6:  Modified independent (Device/Increase time) Static Standing Balance Static Standing - Balance Support: Right upper extremity supported Static Standing - Level of Assistance: 5: Stand by assistance Extremity Assessment      [name] Assessment [name] Assessment: Not tested (hip and knee WFL) LLE Assessment LLE Assessment: Within Functional Limits  See FIM for current functional status  [name] 05/06/2012, 8:34 AM

## 2012-05-06 NOTE — Progress Notes (Signed)
Occupational Therapy Discharge Summary  Patient Details  Name: Ana [name] MRN: [id number] Date of Birth: Jan 19, 1981  Today's Date: 05/06/2012  Patient has met 9 of 9 long term goals due to improved activity tolerance, improved balance and ability to compensate for deficits.  Patient to discharge at [facility] Assist to Supervision level.  Patient's daughter is independent to provide the necessary physical assistance at discharge and patient is independent guiding other potential caregivers when assistance is needed.    Reasons goals not met: n/a secondary to all goals met  Recommendation:  Patient will benefit from ongoing skilled OT services in home health setting initially then recommend outpatient therapy to continue to advance functional skills in the area of BADL, iADL, Vocation and Reduce care partner burden.  Equipment: No equipment provided, recommend [facility] and patient reports she will borrow one from a friend.  Reasons for discharge: treatment goals met and discharge from hospital  Patient/family agrees with progress made and goals achieved: Yes  OT Discharge Precautions/Restrictions  Precautions Precautions: Back;Fall Precaution Comments: [name] Elbow splint at all times except with Therapy for ROM exercises and bathing, [name] AAROM/AROM within pain tolerance Spinal Brace: Thoracolumbosacral orthotic Restrictions Weight Bearing Restrictions: Yes [name] Weight Bearing: Non weight bearing [name] Weight Bearing: Partial weight bearing [name] Partial Weight Bearing Percentage or Pounds: 50% Pain Reports 2/10 pain in back, keeps pain medication on a schedule Cognition Orientation Level: Oriented X4 Sensation Sensation Light Touch: Appears Intact Mobility  Bed Mobility Bed Mobility: Rolling Right Rolling Right: 6: Modified independent (Device/Increase time) Sitting - Scoot to Edge of Bed: 6: Modified independent (Device/Increase time) Sit to Sidelying Right: 6: Modified independent  (Device/Increase time) Transfers Sit to Stand: 5: Supervision Stand to Sit: 5: Supervision  Balance Static Sitting Balance Static Sitting - Level of Assistance: 6: Modified independent (Device/Increase time) Static Standing Balance Static Standing - Balance Support: Right upper extremity supported Static Standing - Level of Assistance: 5: Stand by assistance Extremity/Trunk Assessment RUE Assessment RUE Assessment: Within Functional Limits [name] Assessment [name] Assessment: Exceptions to WFL [name] AROM (degrees) [name] Overall AROM Comments: A/AAROM: Shoulder flex, ext, horiz ab & adduction, internal rot and forearm pronation WFL, Patient limited by pain for full ROM of all other joints.  See FIM for current functional status  [name] 05/06/2012, 4:08 PM
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
## 2012-05-06 NOTE — Progress Notes (Signed)
Discharge summary [phone]
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
## 2012-05-28 NOTE — Progress Notes (Signed)
Patient called for refill on pain medications. Has been using oxycodone 2-3 times a day and has run out of medications.  Rx for Norco 5/325 #90 one q 6 hours prn pain called in to [address], [place].

## 2012-06-01 ENCOUNTER — Encounter: Payer: Self-pay | Admitting: Physical Medicine & Rehabilitation

## 2012-06-01 ENCOUNTER — Encounter: Payer: 59 | Attending: Physical Medicine & Rehabilitation | Admitting: Physical Medicine & Rehabilitation

## 2012-06-01 VITALS — BP 108/73 | HR 88 | Resp 14 | Ht 62.0 in | Wt 148.0 lb

## 2012-06-01 DIAGNOSIS — S5290XA Unspecified fracture of unspecified forearm, initial encounter for closed fracture: Secondary | ICD-10-CM | POA: Insufficient documentation

## 2012-06-01 DIAGNOSIS — S0240DA Maxillary fracture, left side, initial encounter for closed fracture: Secondary | ICD-10-CM

## 2012-06-01 DIAGNOSIS — S82899A Other fracture of unspecified lower leg, initial encounter for closed fracture: Secondary | ICD-10-CM | POA: Insufficient documentation

## 2012-06-01 DIAGNOSIS — S42409B Unspecified fracture of lower end of unspecified humerus, initial encounter for open fracture: Secondary | ICD-10-CM

## 2012-06-01 DIAGNOSIS — X58XXXA Exposure to other specified factors, initial encounter: Secondary | ICD-10-CM | POA: Insufficient documentation

## 2012-06-01 DIAGNOSIS — S82201B Unspecified fracture of shaft of right tibia, initial encounter for open fracture type I or II: Secondary | ICD-10-CM

## 2012-06-01 DIAGNOSIS — S82209B Unspecified fracture of shaft of unspecified tibia, initial encounter for open fracture type I or II: Secondary | ICD-10-CM

## 2012-06-01 DIAGNOSIS — S069X9A Unspecified intracranial injury with loss of consciousness of unspecified duration, initial encounter: Secondary | ICD-10-CM

## 2012-06-01 DIAGNOSIS — S2249XA Multiple fractures of ribs, unspecified side, initial encounter for closed fracture: Secondary | ICD-10-CM | POA: Insufficient documentation

## 2012-06-01 DIAGNOSIS — IMO0002 Reserved for concepts with insufficient information to code with codable children: Secondary | ICD-10-CM | POA: Insufficient documentation

## 2012-06-01 DIAGNOSIS — Z981 Arthrodesis status: Secondary | ICD-10-CM | POA: Insufficient documentation

## 2012-06-01 DIAGNOSIS — S32039A Unspecified fracture of third lumbar vertebra, initial encounter for closed fracture: Secondary | ICD-10-CM

## 2012-06-01 DIAGNOSIS — S32009A Unspecified fracture of unspecified lumbar vertebra, initial encounter for closed fracture: Secondary | ICD-10-CM | POA: Insufficient documentation

## 2012-06-01 DIAGNOSIS — S02400A Malar fracture unspecified, initial encounter for closed fracture: Secondary | ICD-10-CM

## 2012-06-01 NOTE — Patient Instructions (Signed)
Call me with any problems or questions 
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
## 2012-06-15 ENCOUNTER — Telehealth (HOSPITAL_COMMUNITY): Payer: Self-pay | Admitting: Orthopedic Surgery

## 2012-06-15 NOTE — Telephone Encounter (Signed)
Ana [name] needed a letter stating she was unable to care for her child by herself so that she could get vouchers for daycare. When asked specifically she said she wasn't supposed to lift anything heavy because of her back and still couldn't use her arm well. Since we're not seeing her in follow-up I asked her to request the letter from Dr. Lindalou Hose office but if she ran into trouble to call me back and I would try to put something together based on notes from her neurosurgeon and orthopedic surgeon.

## 2012-07-28 ENCOUNTER — Other Ambulatory Visit: Payer: Self-pay | Admitting: *Deleted

## 2012-07-28 MED ORDER — HYDROCODONE-ACETAMINOPHEN 5-325 MG PO TABS: 1.0000 | ORAL_TABLET | Freq: Four times a day (QID) | ORAL | Status: DC | PRN

## 2012-07-28 NOTE — Telephone Encounter (Signed)
Needs a refill on pain medicaiton.  Last ordered was norco 5/325 1 q 6 hr # 90 by Marissa Nestle PA. Refilled and Sahej notified.

## 2012-08-02 ENCOUNTER — Encounter: Payer: Self-pay | Admitting: Physical Medicine & Rehabilitation

## 2012-08-02 ENCOUNTER — Encounter: Payer: 59 | Attending: Physical Medicine & Rehabilitation | Admitting: Physical Medicine & Rehabilitation

## 2012-08-02 VITALS — BP 122/74 | Ht 62.0 in | Wt 150.4 lb

## 2012-08-02 DIAGNOSIS — S32039A Unspecified fracture of third lumbar vertebra, initial encounter for closed fracture: Secondary | ICD-10-CM

## 2012-08-02 DIAGNOSIS — S32009A Unspecified fracture of unspecified lumbar vertebra, initial encounter for closed fracture: Secondary | ICD-10-CM | POA: Insufficient documentation

## 2012-08-02 DIAGNOSIS — X58XXXA Exposure to other specified factors, initial encounter: Secondary | ICD-10-CM | POA: Insufficient documentation

## 2012-08-02 DIAGNOSIS — S0240DA Maxillary fracture, left side, initial encounter for closed fracture: Secondary | ICD-10-CM

## 2012-08-02 DIAGNOSIS — S02401A Maxillary fracture, unspecified, initial encounter for closed fracture: Secondary | ICD-10-CM

## 2012-08-02 DIAGNOSIS — S069X9A Unspecified intracranial injury with loss of consciousness of unspecified duration, initial encounter: Secondary | ICD-10-CM

## 2012-08-02 DIAGNOSIS — S52209A Unspecified fracture of shaft of unspecified ulna, initial encounter for closed fracture: Secondary | ICD-10-CM

## 2012-08-02 DIAGNOSIS — S82201B Unspecified fracture of shaft of right tibia, initial encounter for open fracture type I or II: Secondary | ICD-10-CM

## 2012-08-02 DIAGNOSIS — S82209B Unspecified fracture of shaft of unspecified tibia, initial encounter for open fracture type I or II: Secondary | ICD-10-CM

## 2012-08-02 DIAGNOSIS — S42409B Unspecified fracture of lower end of unspecified humerus, initial encounter for open fracture: Secondary | ICD-10-CM

## 2012-08-02 DIAGNOSIS — S5290XA Unspecified fracture of unspecified forearm, initial encounter for closed fracture: Secondary | ICD-10-CM | POA: Insufficient documentation

## 2012-08-02 MED ORDER — METHOCARBAMOL 500 MG PO TABS: 500.0000 mg | ORAL_TABLET | Freq: Four times a day (QID) | ORAL | Status: DC | PRN

## 2012-08-02 NOTE — Patient Instructions (Signed)
CALL ME WITH QUESTIONS 
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
## 2012-09-14 ENCOUNTER — Telehealth: Payer: Self-pay | Admitting: *Deleted

## 2012-09-14 MED ORDER — HYDROCODONE-ACETAMINOPHEN 5-325 MG PO TABS: 1.0000 | ORAL_TABLET | Freq: Four times a day (QID) | ORAL | Status: DC | PRN

## 2012-09-14 NOTE — Telephone Encounter (Signed)
Refill hydrocodone.  Done and Eilish notified.

## 2012-09-23 ENCOUNTER — Telehealth: Payer: Self-pay | Admitting: *Deleted

## 2012-09-23 NOTE — Telephone Encounter (Signed)
Patient is having trouble getting work extension. Was told by Dr. Riley Kill during last OV to contact him if she had any problems.

## 2012-09-23 NOTE — Telephone Encounter (Signed)
i would happy to write her a note. Please remind me tomorrow. Thanks!   Z

## 2012-09-24 ENCOUNTER — Telehealth: Payer: Self-pay

## 2012-09-24 NOTE — Telephone Encounter (Signed)
Done

## 2012-09-24 NOTE — Telephone Encounter (Signed)
Left message letting patient know her letter is ready for pick up.

## 2012-09-28 ENCOUNTER — Encounter
Payer: No Typology Code available for payment source | Attending: Physical Medicine & Rehabilitation | Admitting: Physical Medicine & Rehabilitation

## 2012-09-28 ENCOUNTER — Encounter: Payer: Self-pay | Admitting: Physical Medicine & Rehabilitation

## 2012-09-28 VITALS — BP 137/82 | HR 91 | Resp 16 | Ht 62.0 in | Wt 157.0 lb

## 2012-09-28 DIAGNOSIS — S82209B Unspecified fracture of shaft of unspecified tibia, initial encounter for open fracture type I or II: Secondary | ICD-10-CM

## 2012-09-28 DIAGNOSIS — S32009A Unspecified fracture of unspecified lumbar vertebra, initial encounter for closed fracture: Secondary | ICD-10-CM

## 2012-09-28 DIAGNOSIS — Z5181 Encounter for therapeutic drug level monitoring: Secondary | ICD-10-CM | POA: Insufficient documentation

## 2012-09-28 DIAGNOSIS — S0240DA Maxillary fracture, left side, initial encounter for closed fracture: Secondary | ICD-10-CM

## 2012-09-28 DIAGNOSIS — S42409B Unspecified fracture of lower end of unspecified humerus, initial encounter for open fracture: Secondary | ICD-10-CM

## 2012-09-28 DIAGNOSIS — S02401A Maxillary fracture, unspecified, initial encounter for closed fracture: Secondary | ICD-10-CM

## 2012-09-28 DIAGNOSIS — S82201B Unspecified fracture of shaft of right tibia, initial encounter for open fracture type I or II: Secondary | ICD-10-CM

## 2012-09-28 DIAGNOSIS — S32039A Unspecified fracture of third lumbar vertebra, initial encounter for closed fracture: Secondary | ICD-10-CM

## 2012-09-28 DIAGNOSIS — X58XXXA Exposure to other specified factors, initial encounter: Secondary | ICD-10-CM | POA: Insufficient documentation

## 2012-09-28 DIAGNOSIS — S82409B Unspecified fracture of shaft of unspecified fibula, initial encounter for open fracture type I or II: Secondary | ICD-10-CM

## 2012-09-28 NOTE — Patient Instructions (Signed)
?  WORK CONDITIONING
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
## 2012-10-06 ENCOUNTER — Telehealth: Payer: Self-pay | Admitting: *Deleted

## 2012-10-06 MED ORDER — HYDROCODONE-ACETAMINOPHEN 5-325 MG PO TABS
1.0000 | ORAL_TABLET | Freq: Four times a day (QID) | ORAL | Status: DC | PRN
Start: 2012-10-06 — End: 2012-11-01

## 2012-10-06 NOTE — Telephone Encounter (Signed)
Refilled

## 2012-10-06 NOTE — Telephone Encounter (Signed)
Ok to refil 

## 2012-10-06 NOTE — Telephone Encounter (Addendum)
Refill hydrocodone. Last refill was 09/14/12. Do you want to refill early?

## 2012-10-18 ENCOUNTER — Telehealth: Payer: Self-pay

## 2012-10-18 DIAGNOSIS — S32009A Unspecified fracture of unspecified lumbar vertebra, initial encounter for closed fracture: Secondary | ICD-10-CM

## 2012-10-18 DIAGNOSIS — S02401A Maxillary fracture, unspecified, initial encounter for closed fracture: Secondary | ICD-10-CM

## 2012-10-18 DIAGNOSIS — S42409B Unspecified fracture of lower end of unspecified humerus, initial encounter for open fracture: Secondary | ICD-10-CM

## 2012-10-18 DIAGNOSIS — S82209B Unspecified fracture of shaft of unspecified tibia, initial encounter for open fracture type I or II: Secondary | ICD-10-CM

## 2012-10-18 NOTE — Telephone Encounter (Signed)
Patient called to say she needs orders for physical therapy.  They need to know what she is supposed to be working on, such as work conditioning?  Per last office note PT will continue to work on her core and ROM, endurance. It would be useful if they could create a work Product manager as well. This information has been forwarded to [place] per patient request.

## 2012-10-27 ENCOUNTER — Telehealth: Payer: Self-pay

## 2012-10-27 NOTE — Telephone Encounter (Signed)
Last fill 10/06/12. Too early

## 2012-10-27 NOTE — Telephone Encounter (Signed)
Patient called to follow up on order for physical therapy that was supposed to be faxed to [facility] in [place].  She also needs a refill on her hydrocodone.

## 2012-11-01 MED ORDER — HYDROCODONE-ACETAMINOPHEN 5-325 MG PO TABS: 1.0000 | ORAL_TABLET | Freq: Four times a day (QID) | ORAL | Status: DC | PRN

## 2012-11-01 NOTE — Telephone Encounter (Signed)
Left message advising patient her hydrocodone had been called in.  Also asked her to call back with a fax number for [facility] since this order has been faxed once and they did not receive it.

## 2012-11-05 ENCOUNTER — Telehealth: Payer: Self-pay | Admitting: *Deleted

## 2012-11-05 NOTE — Telephone Encounter (Signed)
Physical therapy orders refaxed to [facility] physical therapy.

## 2012-11-05 NOTE — Telephone Encounter (Signed)
Leaving fax number for [facility].  #[phone]

## 2012-11-11 ENCOUNTER — Telehealth: Payer: Self-pay

## 2012-11-11 NOTE — Telephone Encounter (Signed)
Patient called to say there seems to be some mix up in her return to work date.  Some forms she has had sent in say march and others say may.  Which is correct?

## 2012-11-15 NOTE — Telephone Encounter (Signed)
Patient called asking if we can fax her case manager and let them know that she is not to return to work until May.  Letter faxed to [phone].

## 2012-12-02 ENCOUNTER — Telehealth: Payer: Self-pay

## 2012-12-02 NOTE — Telephone Encounter (Signed)
Called to make an appointment. She wants to discuss going back to work.

## 2012-12-08 ENCOUNTER — Encounter: Payer: Self-pay | Admitting: Physical Medicine & Rehabilitation

## 2012-12-08 ENCOUNTER — Encounter
Payer: No Typology Code available for payment source | Attending: Physical Medicine & Rehabilitation | Admitting: Physical Medicine & Rehabilitation

## 2012-12-08 VITALS — BP 129/71 | HR 66 | Resp 15 | Ht 62.0 in | Wt 159.8 lb

## 2012-12-08 DIAGNOSIS — S42402G Unspecified fracture of lower end of left humerus, subsequent encounter for fracture with delayed healing: Secondary | ICD-10-CM

## 2012-12-08 DIAGNOSIS — S069X0D Unspecified intracranial injury without loss of consciousness, subsequent encounter: Secondary | ICD-10-CM

## 2012-12-08 DIAGNOSIS — Z5181 Encounter for therapeutic drug level monitoring: Secondary | ICD-10-CM | POA: Insufficient documentation

## 2012-12-08 DIAGNOSIS — S82201E Unspecified fracture of shaft of right tibia, subsequent encounter for open fracture type I or II with routine healing: Secondary | ICD-10-CM

## 2012-12-08 DIAGNOSIS — Z5189 Encounter for other specified aftercare: Secondary | ICD-10-CM

## 2012-12-08 DIAGNOSIS — S82209B Unspecified fracture of shaft of unspecified tibia, initial encounter for open fracture type I or II: Secondary | ICD-10-CM | POA: Insufficient documentation

## 2012-12-08 DIAGNOSIS — X58XXXA Exposure to other specified factors, initial encounter: Secondary | ICD-10-CM | POA: Insufficient documentation

## 2012-12-08 DIAGNOSIS — S42309D Unspecified fracture of shaft of humerus, unspecified arm, subsequent encounter for fracture with routine healing: Secondary | ICD-10-CM

## 2012-12-08 DIAGNOSIS — IMO0002 Reserved for concepts with insufficient information to code with codable children: Secondary | ICD-10-CM

## 2012-12-08 DIAGNOSIS — S8290XD Unspecified fracture of unspecified lower leg, subsequent encounter for closed fracture with routine healing: Secondary | ICD-10-CM

## 2012-12-08 DIAGNOSIS — S32039D Unspecified fracture of third lumbar vertebra, subsequent encounter for fracture with routine healing: Secondary | ICD-10-CM

## 2012-12-08 MED ORDER — HYDROCODONE-ACETAMINOPHEN 5-325 MG PO TABS: 1.0000 | ORAL_TABLET | Freq: Four times a day (QID) | ORAL | Status: DC | PRN
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
## 2012-12-08 NOTE — Patient Instructions (Signed)
CALL ME WITH ANY PROBLEMS OR QUESTIONS (#[phone]).  HAVE A GOOD DAY  

## 2012-12-22 ENCOUNTER — Ambulatory Visit: Payer: 59 | Admitting: Physical Medicine & Rehabilitation

## 2012-12-27 ENCOUNTER — Ambulatory Visit: Payer: 59 | Admitting: Physical Medicine & Rehabilitation

## 2013-01-17 ENCOUNTER — Telehealth: Payer: Self-pay | Admitting: *Deleted

## 2013-01-17 MED ORDER — HYDROCODONE-ACETAMINOPHEN 5-325 MG PO TABS: 1.0000 | ORAL_TABLET | Freq: Four times a day (QID) | ORAL | Status: DC | PRN

## 2013-01-17 NOTE — Telephone Encounter (Signed)
Refill hydrocodone.  Refilled and pt notified. 

## 2013-06-10 ENCOUNTER — Encounter: Payer: Self-pay | Admitting: Physical Medicine & Rehabilitation

## 2013-06-10 ENCOUNTER — Encounter: Payer: Medicaid Other | Attending: Physical Medicine & Rehabilitation | Admitting: Physical Medicine & Rehabilitation

## 2013-06-10 DIAGNOSIS — S32039S Unspecified fracture of third lumbar vertebra, sequela: Secondary | ICD-10-CM

## 2013-06-10 DIAGNOSIS — IMO0002 Reserved for concepts with insufficient information to code with codable children: Secondary | ICD-10-CM

## 2013-06-10 DIAGNOSIS — S069X9S Unspecified intracranial injury with loss of consciousness of unspecified duration, sequela: Secondary | ICD-10-CM

## 2013-06-10 DIAGNOSIS — S82201S Unspecified fracture of shaft of right tibia, sequela: Secondary | ICD-10-CM

## 2013-06-10 DIAGNOSIS — S42413A Displaced simple supracondylar fracture without intercondylar fracture of unspecified humerus, initial encounter for closed fracture: Secondary | ICD-10-CM | POA: Insufficient documentation

## 2013-06-10 DIAGNOSIS — S82899A Other fracture of unspecified lower leg, initial encounter for closed fracture: Secondary | ICD-10-CM | POA: Insufficient documentation

## 2013-06-10 DIAGNOSIS — S5290XA Unspecified fracture of unspecified forearm, initial encounter for closed fracture: Secondary | ICD-10-CM | POA: Insufficient documentation

## 2013-06-10 DIAGNOSIS — S069X0S Unspecified intracranial injury without loss of consciousness, sequela: Secondary | ICD-10-CM

## 2013-06-10 DIAGNOSIS — S8290XS Unspecified fracture of unspecified lower leg, sequela: Secondary | ICD-10-CM

## 2013-06-10 DIAGNOSIS — S2249XA Multiple fractures of ribs, unspecified side, initial encounter for closed fracture: Secondary | ICD-10-CM | POA: Insufficient documentation

## 2013-06-10 NOTE — Patient Instructions (Signed)
PLEASE TRY A QUARTER INCH GEL HEEL CUSION IN YOUR LEFT SHOE.   CONTINUE TO WORK ON RANGE OF MOTION, HEAT, MASSAGE TO YOUR BACK AS POSSIBLE.  TRY USING YOUR ROBAXIN.
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
## 2013-08-16 ENCOUNTER — Emergency Department: Payer: Self-pay | Admitting: Emergency Medicine

## 2013-08-16 LAB — CBC WITH DIFFERENTIAL/PLATELET
Basophil #: 0.1 10*3/uL (ref 0.0–0.1)
Eosinophil #: 0.1 10*3/uL (ref 0.0–0.7)
HCT: 37.3 % (ref 35.0–47.0)
Lymphocyte #: 2 10*3/uL (ref 1.0–3.6)
MCHC: 34.2 g/dL (ref 32.0–36.0)
MCV: 87 fL (ref 80–100)
Monocyte #: 0.4 x10 3/mm (ref 0.2–0.9)
Neutrophil %: 44.1 %
Platelet: 239 10*3/uL (ref 150–440)
RBC: 4.3 10*6/uL (ref 3.80–5.20)
WBC: 4.6 10*3/uL (ref 3.6–11.0)

## 2013-08-16 LAB — DRUG SCREEN, URINE
Cannabinoid 50 Ng, Ur ~~LOC~~: NEGATIVE (ref ?–50)
Cocaine Metabolite,Ur ~~LOC~~: NEGATIVE (ref ?–300)
MDMA (Ecstasy)Ur Screen: NEGATIVE (ref ?–500)
Methadone, Ur Screen: NEGATIVE (ref ?–300)

## 2013-08-16 LAB — BASIC METABOLIC PANEL
BUN: 13 mg/dL (ref 7–18)
Glucose: 107 mg/dL — ABNORMAL HIGH (ref 65–99)
Potassium: 3.7 mmol/L (ref 3.5–5.1)
Sodium: 139 mmol/L (ref 136–145)

## 2013-08-16 LAB — URINALYSIS, COMPLETE
Bacteria: NONE SEEN
Bilirubin,UR: NEGATIVE
Blood: NEGATIVE
Ketone: NEGATIVE
Protein: NEGATIVE
RBC,UR: <1 /HPF (ref 0–5)
Specific Gravity: 1.013 (ref 1.003–1.030)
WBC UR: <1 /HPF (ref 0–5)

## 2013-08-16 LAB — ETHANOL
Ethanol %: <0.003 % (ref 0.000–0.080)
Ethanol: <3 mg/dL

## 2013-08-16 LAB — PREGNANCY, URINE: Pregnancy Test, Urine: NEGATIVE m[IU]/mL

## 2013-08-30 ENCOUNTER — Encounter: Payer: Medicaid Other | Admitting: Physical Medicine & Rehabilitation

## 2014-05-24 ENCOUNTER — Emergency Department: Payer: Self-pay | Admitting: Emergency Medicine

## 2014-05-24 LAB — COMPREHENSIVE METABOLIC PANEL
ALBUMIN: 3.8 g/dL (ref 3.4–5.0)
ALK PHOS: 58 U/L
ALT: 12 U/L — AB
ANION GAP: 7 (ref 7–16)
AST: 19 U/L (ref 15–37)
BUN: 14 mg/dL (ref 7–18)
Bilirubin,Total: 0.7 mg/dL (ref 0.2–1.0)
CHLORIDE: 106 mmol/L (ref 98–107)
Calcium, Total: 9.4 mg/dL (ref 8.5–10.1)
Co2: 31 mmol/L (ref 21–32)
Creatinine: 0.72 mg/dL (ref 0.60–1.30)
EGFR (African American): >60
EGFR (Non-African Amer.): >60
Glucose: 78 mg/dL (ref 65–99)
Osmolality: 286 (ref 275–301)
Potassium: 3.8 mmol/L (ref 3.5–5.1)
Sodium: 144 mmol/L (ref 136–145)
TOTAL PROTEIN: 8 g/dL (ref 6.4–8.2)

## 2014-05-24 LAB — CBC WITH DIFFERENTIAL/PLATELET
Basophil #: 0.1 10*3/uL (ref 0.0–0.1)
Basophil %: 1.1 %
EOS ABS: 0.2 10*3/uL (ref 0.0–0.7)
EOS PCT: 2.9 %
HCT: 38.5 % (ref 35.0–47.0)
HGB: 12.7 g/dL (ref 12.0–16.0)
LYMPHS ABS: 2.2 10*3/uL (ref 1.0–3.6)
LYMPHS PCT: 37.4 %
MCH: 29.3 pg (ref 26.0–34.0)
MCHC: 33 g/dL (ref 32.0–36.0)
MCV: 89 fL (ref 80–100)
MONO ABS: 0.7 x10 3/mm (ref 0.2–0.9)
Monocyte %: 12.5 %
NEUTROS PCT: 46.1 %
Neutrophil #: 2.7 10*3/uL (ref 1.4–6.5)
Platelet: 254 10*3/uL (ref 150–440)
RBC: 4.34 10*6/uL (ref 3.80–5.20)
RDW: 11.7 % (ref 11.5–14.5)
WBC: 6 10*3/uL (ref 3.6–11.0)

## 2014-05-24 LAB — URINALYSIS, COMPLETE
BLOOD: NEGATIVE
Bacteria: NONE SEEN
Bilirubin,UR: NEGATIVE
GLUCOSE, UR: NEGATIVE mg/dL (ref 0–75)
Ketone: NEGATIVE
Nitrite: NEGATIVE
Ph: 6 (ref 4.5–8.0)
Protein: NEGATIVE
RBC,UR: 1 /HPF (ref 0–5)
Specific Gravity: 1.029 (ref 1.003–1.030)
Squamous Epithelial: 4

## 2014-05-25 LAB — GC/CHLAMYDIA PROBE AMP

## 2014-05-25 LAB — WET PREP, GENITAL

## 2018-08-05 HISTORY — PX: BARIATRIC SURGERY: SHX1103

## 2018-10-29 ENCOUNTER — Encounter: Payer: Self-pay | Admitting: Medical Oncology

## 2018-10-29 ENCOUNTER — Emergency Department: Payer: No Typology Code available for payment source

## 2018-10-29 ENCOUNTER — Other Ambulatory Visit: Payer: Self-pay

## 2018-10-29 ENCOUNTER — Emergency Department
Admission: EM | Admit: 2018-10-29 | Discharge: 2018-10-29 | Disposition: A | Discharge status: Home / Self Care | Payer: No Typology Code available for payment source | Attending: Emergency Medicine | Admitting: Emergency Medicine

## 2018-10-29 DIAGNOSIS — Y999 Unspecified external cause status: Secondary | ICD-10-CM | POA: Insufficient documentation

## 2018-10-29 DIAGNOSIS — Y9389 Activity, other specified: Secondary | ICD-10-CM | POA: Diagnosis not present

## 2018-10-29 DIAGNOSIS — S39012A Strain of muscle, fascia and tendon of lower back, initial encounter: Secondary | ICD-10-CM | POA: Insufficient documentation

## 2018-10-29 DIAGNOSIS — Y92410 Unspecified street and highway as the place of occurrence of the external cause: Secondary | ICD-10-CM | POA: Insufficient documentation

## 2018-10-29 DIAGNOSIS — S3992XA Unspecified injury of lower back, initial encounter: Secondary | ICD-10-CM | POA: Diagnosis present

## 2018-10-29 LAB — POCT PREGNANCY, URINE: Preg Test, Ur: NEGATIVE

## 2018-10-29 MED ORDER — ORPHENADRINE CITRATE 30 MG/ML IJ SOLN
60.0000 mg | Freq: Once | INTRAMUSCULAR | Status: AC
  Administered 2018-10-29: 60 mg via INTRAMUSCULAR
  Filled 2018-10-29: qty 2

## 2018-10-29 MED ORDER — PREDNISONE 50 MG PO TABS: 50.0000 mg | ORAL_TABLET | Freq: Every day | ORAL | 0 refills | Status: DC

## 2018-10-29 MED ORDER — DEXAMETHASONE SODIUM PHOSPHATE 10 MG/ML IJ SOLN
10.0000 mg | Freq: Once | INTRAMUSCULAR | Status: AC
  Administered 2018-10-29: 10 mg via INTRAMUSCULAR
  Filled 2018-10-29: qty 1

## 2018-10-29 MED ORDER — METHOCARBAMOL 500 MG PO TABS: 500.0000 mg | ORAL_TABLET | Freq: Four times a day (QID) | ORAL | 0 refills | Status: DC

## 2018-10-29 NOTE — ED Notes (Signed)
Patient transported to X-ray 
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
## 2018-10-29 NOTE — ED Notes (Signed)
Pt up to restroom to provide urine sample.
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
## 2019-06-07 ENCOUNTER — Other Ambulatory Visit: Payer: Self-pay | Admitting: *Deleted

## 2019-06-07 DIAGNOSIS — Z20822 Contact with and (suspected) exposure to covid-19: Secondary | ICD-10-CM

## 2019-06-08 LAB — NOVEL CORONAVIRUS, NAA: SARS-CoV-2, NAA: NOT DETECTED
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
## 2021-09-30 ENCOUNTER — Emergency Department: Payer: BC Managed Care – PPO

## 2021-09-30 ENCOUNTER — Other Ambulatory Visit: Payer: Self-pay

## 2021-09-30 ENCOUNTER — Emergency Department
Admission: EM | Admit: 2021-09-30 | Discharge: 2021-09-30 | Disposition: A | Discharge status: Home / Self Care | Payer: BC Managed Care – PPO | Attending: Emergency Medicine | Admitting: Emergency Medicine

## 2021-09-30 DIAGNOSIS — R0781 Pleurodynia: Secondary | ICD-10-CM | POA: Diagnosis not present

## 2021-09-30 DIAGNOSIS — R0602 Shortness of breath: Secondary | ICD-10-CM | POA: Diagnosis not present

## 2021-09-30 DIAGNOSIS — R5383 Other fatigue: Secondary | ICD-10-CM | POA: Diagnosis not present

## 2021-09-30 DIAGNOSIS — R0789 Other chest pain: Secondary | ICD-10-CM

## 2021-09-30 LAB — CBC
HCT: 35.1 % — ABNORMAL LOW (ref 36.0–46.0)
Hemoglobin: 12.1 g/dL (ref 12.0–15.0)
MCH: 30.1 pg (ref 26.0–34.0)
MCHC: 34.5 g/dL (ref 30.0–36.0)
MCV: 87.3 fL (ref 80.0–100.0)
Platelets: 198 10*3/uL (ref 150–400)
RBC: 4.02 MIL/uL (ref 3.87–5.11)
RDW: 11.2 % — ABNORMAL LOW (ref 11.5–15.5)
WBC: 3.9 10*3/uL — ABNORMAL LOW (ref 4.0–10.5)
nRBC: 0 % (ref 0.0–0.2)

## 2021-09-30 LAB — BASIC METABOLIC PANEL
Anion gap: 3 — ABNORMAL LOW (ref 5–15)
BUN: 12 mg/dL (ref 6–20)
CO2: 26 mmol/L (ref 22–32)
Calcium: 8.6 mg/dL — ABNORMAL LOW (ref 8.9–10.3)
Chloride: 109 mmol/L (ref 98–111)
Creatinine, Ser: 0.52 mg/dL (ref 0.44–1.00)
GFR, Estimated: >60 mL/min (ref >60)
Glucose, Bld: 100 mg/dL — ABNORMAL HIGH (ref 70–99)
Potassium: 3.6 mmol/L (ref 3.5–5.1)
Sodium: 138 mmol/L (ref 135–145)

## 2021-09-30 LAB — TROPONIN I (HIGH SENSITIVITY): Troponin I (High Sensitivity): <2 ng/L (ref <18)

## 2021-09-30 MED ORDER — OMEPRAZOLE MAGNESIUM 20 MG PO TBEC: 20.0000 mg | DELAYED_RELEASE_TABLET | Freq: Every day | ORAL | 0 refills | Status: AC

## 2021-09-30 NOTE — ED Triage Notes (Signed)
Pt c/o generalized weakness since Thursday, states since yesterday having left sided chest pain with nausea and SOB. Pt is ambulatory on arrival with NAD noted at this time

## 2021-09-30 NOTE — ED Provider Notes (Signed)
[facility]  [facility] Provider Note    Event Date/Time   First MD Initiated Contact with Patient 09/30/21 1222     (approximate)   History   Chest Pain   HPI Ana [name] is a 41 y.o. female with a past medical history of and status post spinal fixation who presents for chest pain, shortness of breath, and fatigue over the last 5 days.  Patient states that the fatigue has been ongoing for the last 5 days however her chest pain with shortness of breath is only worsened over the last 2 days.  Patient endorses pleuritic pain in the chest as well as generalized tightness that is worse when laying flat.  Patient states that after a heavy meal with wine last night she began having chest tightness that radiated up into the back of her throat and was worsened with lying flat.  Patient denies any relieving factors nor she tried any medications for this pain.  Patient denies any vomiting/diarrhea, dyspnea on exertion, or weakness/numbness/paresthesias in any extremity     Physical Exam   Triage Vital Signs: ED Triage Vitals [09/30/21 1025]  Enc Vitals Group     BP 120/76     Pulse Rate 79     Resp 19     Temp 98.2 F (36.8 C)     Temp Source Oral     SpO2 99 %     Weight 123 lb (55.8 kg)     Height 5\' 2"  (1.575 m)     Head Circumference      Peak Flow      Pain Score      Pain Loc      Pain Edu?      Excl. in [place]?     Most recent vital signs: Vitals:   09/30/21 1025  BP: 120/76  Pulse: 79  Resp: 19  Temp: 98.2 F (36.8 C)  SpO2: 99%    General: Awake, no distress.  CV:  Good peripheral perfusion.  Resp:  Normal effort.  Abd:  No distention.  Nontender Other:  Middle-aged Ana [name] female laying in bed in no distress   ED Results / Procedures / Treatments   Labs (all labs ordered are listed, but only abnormal results are displayed) Labs Reviewed  BASIC METABOLIC PANEL - Abnormal; Notable for the following components:      Result Value    Glucose, Bld 100 (*)    Calcium 8.6 (*)    Anion gap 3 (*)    All other components within normal limits  CBC - Abnormal; Notable for the following components:   WBC 3.9 (*)    HCT 35.1 (*)    RDW 11.2 (*)    All other components within normal limits  POC URINE PREG, ED  TROPONIN I (HIGH SENSITIVITY)  TROPONIN I (HIGH SENSITIVITY)     EKG ED ECG REPORT I, Naaman Plummer, the attending physician, personally viewed and interpreted this ECG.  Date: 09/30/2021 EKG Time: 1023 Rate: 74 Rhythm: normal sinus rhythm QRS Axis: normal Intervals: normal ST/T Wave abnormalities: normal Narrative Interpretation: no evidence of acute ischemia   RADIOLOGY ED MD interpretation: 2 view chest x-ray shows no evidence of acute abnormalities including no pneumonia, pneumothorax, or widened mediastinum.  Agree with radiology assessment  Official radiology report(s): DG Chest 2 View  Result Date: 09/30/2021 CLINICAL DATA:  Chest pain EXAM: CHEST - 2 VIEW COMPARISON:  04/22/2012 FINDINGS: The heart size and mediastinal contours are  within normal limits. Both lungs are clear. No pleural effusion or pneumothorax. Partial visualization of lumbar spine posterior rod and screw fusion hardware. Cholecystectomy clips. Bilateral nipple piercings are incidentally noted. IMPRESSION: No active cardiopulmonary disease. Electronically Signed   By: Yvonne Kendall M.D.   On: 09/30/2021 10:51      PROCEDURES:  Critical Care performed: No  Procedures   MEDICATIONS ORDERED IN ED: Medications - No data to display   IMPRESSION / MDM / [place] / ED COURSE  I reviewed the triage vital signs and the nursing notes.                              Differential diagnosis includes, but is not limited to, ACS, pneumonia, pneumothorax, PE  The patient is on the cardiac monitor to evaluate for evidence of arrhythmia and/or significant heart rate changes.  Workup: ECG, CXR, CBC, BMP,  Troponin Findings: ECG: No overt evidence of STEMI. No evidence of Brugadas sign, delta wave, epsilon wave, significantly prolonged QTc, or malignant arrhythmia HS Troponin: Negative x1 Other Labs unremarkable for emergent problems. CXR: Without PTX, PNA, or widened mediastinum Last Stress Test: Never Last Heart Catheterization: Never HEART Score: 2  Given History, Exam, and Workup I have low suspicion for ACS, Pneumothorax, Pneumonia, Pulmonary Embolus, Tamponade, Aortic Dissection or other emergent problem as a cause for this presentation.   Reassesment: Prior to discharge patients pain was controlled and they were well appearing.  Disposition:  Discharge. Strict return precautions discussed with patient with full understanding. Advised patient to follow up promptly with primary care provider       FINAL CLINICAL IMPRESSION(S) / ED DIAGNOSES   Final diagnoses:  Atypical chest pain     Rx / DC Orders   ED Discharge Orders          Ordered    omeprazole (PRILOSEC OTC) 20 MG tablet  Daily at bedtime        09/30/21 1318             Note:  This document was prepared using Dragon voice recognition software and may include unintentional dictation errors.   Naaman Plummer, MD 09/30/21 1318

## 2021-12-30 ENCOUNTER — Other Ambulatory Visit: Payer: Self-pay | Admitting: Obstetrics and Gynecology
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
## 2022-03-11 ENCOUNTER — Encounter
Admission: RE | Admit: 2022-03-11 | Discharge: 2022-03-11 | Disposition: A | Discharge status: Home / Self Care | Payer: BC Managed Care – PPO | Source: Ambulatory Visit | Attending: Obstetrics and Gynecology | Admitting: Obstetrics and Gynecology

## 2022-03-11 VITALS — Ht 62.0 in | Wt 126.0 lb

## 2022-03-11 DIAGNOSIS — Z01812 Encounter for preprocedural laboratory examination: Secondary | ICD-10-CM

## 2022-03-11 HISTORY — DX: Sleep apnea, unspecified: G47.30

## 2022-03-11 HISTORY — DX: Anemia, unspecified: D64.9

## 2022-03-12 ENCOUNTER — Encounter
Admission: RE | Admit: 2022-03-12 | Discharge: 2022-03-12 | Disposition: A | Discharge status: Home / Self Care | Payer: BC Managed Care – PPO | Source: Ambulatory Visit | Attending: Obstetrics and Gynecology | Admitting: Obstetrics and Gynecology

## 2022-03-12 DIAGNOSIS — Z01812 Encounter for preprocedural laboratory examination: Secondary | ICD-10-CM | POA: Diagnosis not present

## 2022-03-12 DIAGNOSIS — Z01818 Encounter for other preprocedural examination: Secondary | ICD-10-CM

## 2022-03-12 LAB — BASIC METABOLIC PANEL
Anion gap: 4 — ABNORMAL LOW (ref 5–15)
BUN: 11 mg/dL (ref 6–20)
CO2: 26 mmol/L (ref 22–32)
Calcium: 9.1 mg/dL (ref 8.9–10.3)
Chloride: 109 mmol/L (ref 98–111)
Creatinine, Ser: 0.56 mg/dL (ref 0.44–1.00)
GFR, Estimated: >60 mL/min (ref >60)
Glucose, Bld: 115 mg/dL — ABNORMAL HIGH (ref 70–99)
Potassium: 3.9 mmol/L (ref 3.5–5.1)
Sodium: 139 mmol/L (ref 135–145)

## 2022-03-12 LAB — CBC
HCT: 35.7 % — ABNORMAL LOW (ref 36.0–46.0)
Hemoglobin: 11.9 g/dL — ABNORMAL LOW (ref 12.0–15.0)
MCH: 29.2 pg (ref 26.0–34.0)
MCHC: 33.3 g/dL (ref 30.0–36.0)
MCV: 87.5 fL (ref 80.0–100.0)
Platelets: 230 10*3/uL (ref 150–400)
RBC: 4.08 MIL/uL (ref 3.87–5.11)
RDW: 11.3 % — ABNORMAL LOW (ref 11.5–15.5)
WBC: 3.3 10*3/uL — ABNORMAL LOW (ref 4.0–10.5)
nRBC: 0 % (ref 0.0–0.2)

## 2022-03-12 LAB — TYPE AND SCREEN
ABO/RH(D): A POS
Antibody Screen: NEGATIVE

## 2022-03-19 MED ORDER — CEFAZOLIN SODIUM-DEXTROSE 2-4 GM/100ML-% IV SOLN
2.0000 g | Freq: Once | INTRAVENOUS | Status: AC
  Administered 2022-03-20: 2 g via INTRAVENOUS

## 2022-03-19 MED ORDER — ORAL CARE MOUTH RINSE: 15.0000 mL | Freq: Once | OROMUCOSAL | Status: AC

## 2022-03-19 MED ORDER — CEFAZOLIN SODIUM-DEXTROSE 2-4 GM/100ML-% IV SOLN: 2.0000 g | Freq: Once | INTRAVENOUS | Status: DC

## 2022-03-19 MED ORDER — FAMOTIDINE 20 MG PO TABS: 20.0000 mg | ORAL_TABLET | Freq: Once | ORAL | Status: AC

## 2022-03-19 MED ORDER — ACETAMINOPHEN 500 MG PO TABS: 1000.0000 mg | ORAL_TABLET | ORAL | Status: AC

## 2022-03-19 MED ORDER — CHLORHEXIDINE GLUCONATE 0.12 % MT SOLN: 15.0000 mL | Freq: Once | OROMUCOSAL | Status: AC

## 2022-03-19 MED ORDER — LACTATED RINGERS IV SOLN: INTRAVENOUS | Status: DC

## 2022-03-19 MED ORDER — POVIDONE-IODINE 10 % EX SWAB: 2.0000 | Freq: Once | CUTANEOUS | Status: DC

## 2022-03-19 MED ORDER — GABAPENTIN 300 MG PO CAPS: 300.0000 mg | ORAL_CAPSULE | ORAL | Status: DC

## 2022-03-19 MED ORDER — GABAPENTIN 300 MG PO CAPS: 300.0000 mg | ORAL_CAPSULE | ORAL | Status: AC

## 2022-03-19 MED ORDER — ACETAMINOPHEN 500 MG PO TABS: 1000.0000 mg | ORAL_TABLET | ORAL | Status: DC

## 2022-03-19 MED ORDER — POVIDONE-IODINE 10 % EX SWAB
2.0000 | Freq: Once | CUTANEOUS | Status: AC
  Administered 2022-03-20: 2 via TOPICAL

## 2022-03-20 ENCOUNTER — Other Ambulatory Visit: Payer: Self-pay

## 2022-03-20 ENCOUNTER — Ambulatory Visit: Payer: BC Managed Care – PPO | Admitting: Urgent Care

## 2022-03-20 ENCOUNTER — Encounter
Admission: RE | Disposition: A | Discharge status: Home / Self Care | Payer: Self-pay | Source: Home / Self Care | Attending: Obstetrics and Gynecology

## 2022-03-20 ENCOUNTER — Encounter: Payer: Self-pay | Admitting: Obstetrics and Gynecology

## 2022-03-20 ENCOUNTER — Ambulatory Visit
Admission: RE | Admit: 2022-03-20 | Discharge: 2022-03-20 | Disposition: A | Discharge status: Home / Self Care | Payer: BC Managed Care – PPO | Attending: Obstetrics and Gynecology | Admitting: Obstetrics and Gynecology

## 2022-03-20 ENCOUNTER — Ambulatory Visit: Payer: BC Managed Care – PPO | Admitting: Anesthesiology

## 2022-03-20 DIAGNOSIS — D259 Leiomyoma of uterus, unspecified: Secondary | ICD-10-CM | POA: Diagnosis not present

## 2022-03-20 DIAGNOSIS — D759 Disease of blood and blood-forming organs, unspecified: Secondary | ICD-10-CM | POA: Diagnosis not present

## 2022-03-20 DIAGNOSIS — Z01818 Encounter for other preprocedural examination: Secondary | ICD-10-CM

## 2022-03-20 DIAGNOSIS — Z01812 Encounter for preprocedural laboratory examination: Secondary | ICD-10-CM

## 2022-03-20 DIAGNOSIS — N871 Moderate cervical dysplasia: Secondary | ICD-10-CM | POA: Diagnosis not present

## 2022-03-20 DIAGNOSIS — D649 Anemia, unspecified: Secondary | ICD-10-CM | POA: Diagnosis not present

## 2022-03-20 HISTORY — PX: BILATERAL SALPINGECTOMY: SHX5743

## 2022-03-20 HISTORY — PX: VAGINAL HYSTERECTOMY: SHX2639

## 2022-03-20 LAB — POCT PREGNANCY, URINE: Preg Test, Ur: NEGATIVE

## 2022-03-20 SURGERY — HYSTERECTOMY, VAGINAL
Anesthesia: General

## 2022-03-20 MED ORDER — ROCURONIUM BROMIDE 10 MG/ML (PF) SYRINGE
PREFILLED_SYRINGE | INTRAVENOUS | Status: AC
  Filled 2022-03-20: qty 10

## 2022-03-20 MED ORDER — KETOROLAC TROMETHAMINE 30 MG/ML IJ SOLN
INTRAMUSCULAR | Status: AC
  Filled 2022-03-20: qty 1

## 2022-03-20 MED ORDER — OXYCODONE HCL 5 MG PO TABS
ORAL_TABLET | ORAL | Status: AC
  Filled 2022-03-20: qty 1

## 2022-03-20 MED ORDER — DEXMEDETOMIDINE HCL IN NACL 200 MCG/50ML IV SOLN
INTRAVENOUS | Status: DC | PRN
  Administered 2022-03-20 (×3): 4 ug via INTRAVENOUS
  Administered 2022-03-20: 8 ug via INTRAVENOUS

## 2022-03-20 MED ORDER — EPHEDRINE 5 MG/ML INJ
INTRAVENOUS | Status: AC
Start: 2022-03-20 — End: ?
  Filled 2022-03-20: qty 5

## 2022-03-20 MED ORDER — LIDOCAINE-EPINEPHRINE 1 %-1:100000 IJ SOLN
INTRAMUSCULAR | Status: DC | PRN
  Administered 2022-03-20: 10 mL

## 2022-03-20 MED ORDER — PROPOFOL 10 MG/ML IV BOLUS
INTRAVENOUS | Status: AC
  Filled 2022-03-20: qty 20

## 2022-03-20 MED ORDER — MIDAZOLAM HCL 2 MG/2ML IJ SOLN
INTRAMUSCULAR | Status: DC | PRN
  Administered 2022-03-20: 2 mg via INTRAVENOUS

## 2022-03-20 MED ORDER — GABAPENTIN 300 MG PO CAPS
ORAL_CAPSULE | ORAL | Status: AC
  Administered 2022-03-20: 300 mg via ORAL
  Filled 2022-03-20: qty 1

## 2022-03-20 MED ORDER — FENTANYL CITRATE (PF) 100 MCG/2ML IJ SOLN
INTRAMUSCULAR | Status: AC
  Filled 2022-03-20: qty 2

## 2022-03-20 MED ORDER — LIDOCAINE HCL (CARDIAC) PF 100 MG/5ML IV SOSY
PREFILLED_SYRINGE | INTRAVENOUS | Status: DC | PRN
  Administered 2022-03-20: 60 mg via INTRAVENOUS

## 2022-03-20 MED ORDER — ONDANSETRON 4 MG PO TBDP: 4.0000 mg | ORAL_TABLET | Freq: Four times a day (QID) | ORAL | Status: DC | PRN

## 2022-03-20 MED ORDER — DEXAMETHASONE SODIUM PHOSPHATE 10 MG/ML IJ SOLN
INTRAMUSCULAR | Status: DC | PRN
  Administered 2022-03-20: 10 mg via INTRAVENOUS

## 2022-03-20 MED ORDER — FENTANYL CITRATE (PF) 100 MCG/2ML IJ SOLN
25.0000 ug | INTRAMUSCULAR | Status: DC | PRN
  Administered 2022-03-20: 25 ug via INTRAVENOUS

## 2022-03-20 MED ORDER — OXYCODONE HCL 5 MG PO TABS
5.0000 mg | ORAL_TABLET | ORAL | Status: DC | PRN
  Administered 2022-03-20: 5 mg via ORAL

## 2022-03-20 MED ORDER — LIDOCAINE-EPINEPHRINE 1 %-1:100000 IJ SOLN
INTRAMUSCULAR | Status: AC
  Filled 2022-03-20: qty 1

## 2022-03-20 MED ORDER — MIDAZOLAM HCL 2 MG/2ML IJ SOLN
INTRAMUSCULAR | Status: AC
  Filled 2022-03-20: qty 2

## 2022-03-20 MED ORDER — ONDANSETRON HCL 4 MG/2ML IJ SOLN
4.0000 mg | Freq: Once | INTRAMUSCULAR | Status: AC | PRN
Start: 2022-03-20 — End: 2022-03-20
  Administered 2022-03-20: 4 mg via INTRAVENOUS

## 2022-03-20 MED ORDER — KETOROLAC TROMETHAMINE 30 MG/ML IJ SOLN
INTRAMUSCULAR | Status: DC | PRN
  Administered 2022-03-20: 30 mg via INTRAVENOUS

## 2022-03-20 MED ORDER — ONDANSETRON HCL 4 MG/2ML IJ SOLN
INTRAMUSCULAR | Status: AC
  Filled 2022-03-20: qty 2

## 2022-03-20 MED ORDER — ONDANSETRON HCL 4 MG/2ML IJ SOLN
INTRAMUSCULAR | Status: DC | PRN
  Administered 2022-03-20: 4 mg via INTRAVENOUS

## 2022-03-20 MED ORDER — FAMOTIDINE 20 MG PO TABS
ORAL_TABLET | ORAL | Status: AC
  Administered 2022-03-20: 20 mg via ORAL
  Filled 2022-03-20: qty 1

## 2022-03-20 MED ORDER — FENTANYL CITRATE (PF) 100 MCG/2ML IJ SOLN
INTRAMUSCULAR | Status: DC | PRN
Start: 2022-03-20 — End: 2022-03-20
  Administered 2022-03-20 (×4): 25 ug via INTRAVENOUS

## 2022-03-20 MED ORDER — DEXAMETHASONE SODIUM PHOSPHATE 10 MG/ML IJ SOLN
INTRAMUSCULAR | Status: AC
  Filled 2022-03-20: qty 1

## 2022-03-20 MED ORDER — CEFAZOLIN SODIUM-DEXTROSE 2-4 GM/100ML-% IV SOLN
INTRAVENOUS | Status: AC
  Filled 2022-03-20: qty 100

## 2022-03-20 MED ORDER — ACETAMINOPHEN 500 MG PO TABS
ORAL_TABLET | ORAL | Status: AC
  Administered 2022-03-20: 1000 mg via ORAL
  Filled 2022-03-20: qty 2

## 2022-03-20 MED ORDER — PROPOFOL 10 MG/ML IV BOLUS
INTRAVENOUS | Status: DC | PRN
  Administered 2022-03-20: 150 mg via INTRAVENOUS

## 2022-03-20 MED ORDER — CHLORHEXIDINE GLUCONATE 0.12 % MT SOLN
OROMUCOSAL | Status: AC
  Administered 2022-03-20: 15 mL via OROMUCOSAL
  Filled 2022-03-20: qty 15

## 2022-03-20 MED ORDER — LIDOCAINE HCL (PF) 2 % IJ SOLN
INTRAMUSCULAR | Status: AC
  Filled 2022-03-20: qty 5

## 2022-03-20 SURGICAL SUPPLY — 42 items
BACTOSHIELD CHG 4% 4OZ (MISCELLANEOUS) ×1
BAG DRN RND TRDRP ANRFLXCHMBR (UROLOGICAL SUPPLIES) ×2
BAG URINE DRAIN 2000ML AR STRL (UROLOGICAL SUPPLIES) ×3 IMPLANT
CATH FOLEY 2WAY  5CC 16FR (CATHETERS) ×1
CATH FOLEY 2WAY 5CC 16FR (CATHETERS) ×2
CATH ROBINSON RED A/P 16FR (CATHETERS) ×3 IMPLANT
CATH URTH 16FR FL 2W BLN LF (CATHETERS) ×2 IMPLANT
DRAPE PERI LITHO V/GYN (MISCELLANEOUS) ×3 IMPLANT
DRAPE SURG 17X11 SM STRL (DRAPES) ×3 IMPLANT
DRAPE UNDER BUTTOCK W/FLU (DRAPES) ×3 IMPLANT
ELECT REM PT RETURN 9FT ADLT (ELECTROSURGICAL) ×3
ELECTRODE REM PT RTRN 9FT ADLT (ELECTROSURGICAL) ×2 IMPLANT
GAUZE 4X4 16PLY ~~LOC~~+RFID DBL (SPONGE) ×6 IMPLANT
GLOVE SURG SYN 8.0 (GLOVE) ×3 IMPLANT
GLOVE SURG SYN 8.0 PF PI (GLOVE) ×2 IMPLANT
GOWN STRL REUS W/ TWL LRG LVL3 (GOWN DISPOSABLE) ×6 IMPLANT
GOWN STRL REUS W/ TWL XL LVL3 (GOWN DISPOSABLE) ×2 IMPLANT
GOWN STRL REUS W/TWL LRG LVL3 (GOWN DISPOSABLE) ×9
GOWN STRL REUS W/TWL XL LVL3 (GOWN DISPOSABLE) ×3
KIT TURNOVER CYSTO (KITS) ×3 IMPLANT
LABEL OR SOLS (LABEL) ×3 IMPLANT
MANIFOLD NEPTUNE II (INSTRUMENTS) ×3 IMPLANT
NEEDLE HYPO 22GX1.5 SAFETY (NEEDLE) ×3 IMPLANT
PACK BASIN MINOR ARMC (MISCELLANEOUS) ×3 IMPLANT
PAD OB MATERNITY 4.3X12.25 (PERSONAL CARE ITEMS) ×3 IMPLANT
PAD PREP 24X41 OB/GYN DISP (PERSONAL CARE ITEMS) ×3 IMPLANT
SCRUB CHG 4% DYNA-HEX 4OZ (MISCELLANEOUS) ×2 IMPLANT
SOL PREP PVP 2OZ (MISCELLANEOUS) ×3
SOL SCRUB PVP POV-IOD 4OZ 7.5% (MISCELLANEOUS) ×3
SOLUTION PREP PVP 2OZ (MISCELLANEOUS) ×2 IMPLANT
SOLUTION SCRB POV-IOD 4OZ 7.5% (MISCELLANEOUS) ×2 IMPLANT
SURGILUBE 2OZ TUBE FLIPTOP (MISCELLANEOUS) ×3 IMPLANT
SUT PDS 2-0 27IN (SUTURE) ×1 IMPLANT
SUT VIC AB 0 CT1 27 (SUTURE) ×6
SUT VIC AB 0 CT1 27XCR 8 STRN (SUTURE) ×4 IMPLANT
SUT VIC AB 0 CT1 36 (SUTURE) ×3 IMPLANT
SUT VIC AB 2-0 SH 27 (SUTURE) ×3
SUT VIC AB 2-0 SH 27XBRD (SUTURE) ×2 IMPLANT
SYR 10ML LL (SYRINGE) ×3 IMPLANT
SYR CONTROL 10ML LL (SYRINGE) ×3 IMPLANT
WATER STERILE IRR 1000ML POUR (IV SOLUTION) ×3 IMPLANT
WATER STERILE IRR 500ML POUR (IV SOLUTION) ×3 IMPLANT

## 2022-03-20 NOTE — Anesthesia Procedure Notes (Signed)
Procedure Name: LMA Insertion Date/Time: 03/20/2022 10:37 AM  Performed by: Jonna Clark, CRNAPre-anesthesia Checklist: Patient identified, Patient being monitored, Timeout performed, Emergency Drugs available and Suction available Patient Re-evaluated:Patient Re-evaluated prior to induction Oxygen Delivery Method: Circle system utilized Preoxygenation: Pre-oxygenation with 100% oxygen Induction Type: IV induction Ventilation: Mask ventilation without difficulty LMA: LMA inserted LMA Size: 4.0 Tube type: Oral Number of attempts: 1 Placement Confirmation: positive ETCO2 and breath sounds checked- equal and bilateral Tube secured with: Tape Dental Injury: Teeth and Oropharynx as per pre-operative assessment

## 2022-03-20 NOTE — Op Note (Signed)
[name], [name] MEDICAL RECORD NO: [id number] ACCOUNT NO: [id number] DATE OF BIRTH: Feb 07, 1981 FACILITY: [facility] LOCATION: [facility] PHYSICIAN: Boykin Nearing, MD  Operative Report   DATE OF PROCEDURE: 03/20/2022  PREOPERATIVE DIAGNOSIS:  Cervical intraepithelial neoplasia, grade II.  POSTOPERATIVE DIAGNOSIS:  Cervical intraepithelial neoplasia, grade II.  PROCEDURES PERFORMED:  1.  Total vaginal hysterectomy. 2.  Bilateral salpingectomy.  ANESTHESIA:  General endotracheal anesthesia.  SURGEON:  Boykin Nearing, MD  FIRST ASSISTANT:  Donneta Romberg, MD.  SECOND ASSISTANT:  PA student Ulis Rias.  INDICATIONS FOR PROCEDURE:  A 41 year old gravida 3, para 3, status post cervical biopsy, which showed cervical intraepithelial neoplasia, grade II.  The patient was offered alternatives to definitive surgery including cold knife conization and cervical  LEEP procedure.  The patient wishes to have definitive surgery.  DESCRIPTION OF PROCEDURE:  After adequate general endotracheal anesthesia, the patient was placed in dorsal supine position with the legs in the candy cane stirrups.  The patient's abdomen, perineum and vagina were prepped and draped in normal sterile  fashion.  The patient did receive 2 grams of IV Ancef prior to commencement of the case.  Timeout was performed.  Weighted speculum was placed into the vagina and the cervix was circumferentially injected with 1% lidocaine with 1:100,000 epinephrine.  A  direct posterior colpotomy incision was made upon entry into the posterior cul-de-sac.  Straw-colored serous fluid resulted.  The peritoneal edge was then sutured to the posterior vaginal epithelium.  A long billed speculum was placed into the posterior  aspect of the cul-de-sac.  The uterosacral ligaments were then bilaterally clamped, transected and suture ligated with 0 Vicryl suture.  Anterior cervix was circumferentially incised with the Bovie.  Cardinal  ligaments were then bilaterally clamped,  transected and suture ligated with 0 Vicryl suture.  The anterior cul-de-sac was entered sharply without difficulty and the Deaver retractor was placed within to elevate the bladder anteriorly.  Uterine arteries were then bilaterally clamped, transected  and suture ligated with 0 Vicryl suture.  Sequential clamping and suturing continued until the cornua were then bilaterally clamped, transected and the cervix and uterus was delivered.  The cornual pedicles were doubly ligated with 0 Vicryl suture.   Ovaries appeared normal.  Distal portion of each fallopian tube was grasped with a Babcock clamp and was then clamped with a Heaney-Ballantine and the distal portion of each fallopian tube was removed and each pedicle was doubly ligated.  Each pedicle  was secured with 0 Vicryl suture.  Good hemostasis was noted.  The vaginal cuff and cornual pedicles were then cut.  The peritoneum was then closed with a pursestring 2-0 PDS suture with good approximation of the peritoneum.  The vaginal cuff was then  closed with 0 Vicryl suture in a running nonlocking fashion in a vertical fashion.  The patient tolerated the procedure well.  There were no complications.  ESTIMATED BLOOD LOSS:  30 mL.  INTRAOPERATIVE FLUIDS: Uterosacral ligaments were plicated centrally and the rest of the cuff was closed without difficulty.  Good cosmetic effect.  Good hemostasis noted.  At the end of the procedure, the bladder was recatheterized yielding an  additional 25 mL of urine.  At the beginning of the case, a straight catheterization yielded 75 mL clear urine.  COMPLICATIONS:  There were no complications.  DISPOSITION:  The patient was taken to recovery room in good condition.   MUK D: 03/20/2022 2:29:03 pm T: 03/20/2022 9:49:00 pm  JOB: [id number]/ [id number]

## 2022-03-20 NOTE — Anesthesia Preprocedure Evaluation (Addendum)
Anesthesia Evaluation  Patient identified by MRN, date of birth, ID band Patient awake    Reviewed: Allergy & Precautions, NPO status , Patient's Chart, lab work & pertinent test results  Airway Mallampati: II  TM Distance: >3 FB Neck ROM: Full    Dental  (+) Teeth Intact   Pulmonary neg pulmonary ROS,    Pulmonary exam normal breath sounds clear to auscultation       Cardiovascular Exercise Tolerance: Good negative cardio ROS Normal cardiovascular exam Rhythm:Regular     Neuro/Psych Depression negative neurological ROS  negative psych ROS   GI/Hepatic negative GI ROS, Neg liver ROS,   Endo/Other  negative endocrine ROS  Renal/GU negative Renal ROS  negative genitourinary   Musculoskeletal   Abdominal Normal abdominal exam  (+)   Peds negative pediatric ROS (+)  Hematology negative hematology ROS (+) Blood dyscrasia, anemia ,   Anesthesia Other Findings Past Medical History: No date: Anemia     Comment:  history of No date: Depression, postpartum 04/17/2012: Fracture of radius and ulna, closed 04/17/2012: Open fracture of distal humerus No date: Sleep apnea     Comment:  hx of  Past Surgical History: 2013: BACK SURGERY 08/05/2018: BARIATRIC SURGERY 04/21/2012: CLOSED REDUCTION NASAL FRACTURE     Comment:  Procedure: CLOSED REDUCTION NASAL FRACTURE;  Surgeon:               Charlie Pitter, MD;  Location: [facility];  Service:               Neurosurgery;  Laterality: N/A; No date: INTRAUTERINE DEVICE INSERTION 04/17/2012: ORIF HUMERUS FRACTURE     Comment:  Procedure: OPEN REDUCTION INTERNAL FIXATION (ORIF)               DISTAL HUMERUS FRACTURE;  Surgeon: Mauri Pole, MD;                Location: [place];  Service: Orthopedics;  Laterality:               Left;  open reduction internal fixation left distal               humerus and both bone forearm 04/17/2012: TIBIA IM NAIL INSERTION     Comment:   Procedure: INTRAMEDULLARY (IM) NAIL TIBIAL;  Surgeon:               Mauri Pole, MD;  Location: [place];  Service:               Orthopedics;  Laterality: Right;  intramedullary nail               right tibia  BMI    Body Mass Index: 22.86 kg/m      Reproductive/Obstetrics negative OB ROS                             Anesthesia Physical Anesthesia Plan  ASA: 2  Anesthesia Plan: General   Post-op Pain Management:    Induction: Intravenous  PONV Risk Score and Plan: 1 and Dexamethasone, Ondansetron, Midazolam and Treatment may vary due to age or medical condition  Airway Management Planned: LMA and Oral ETT  Additional Equipment:   Intra-op Plan:   Post-operative Plan: Extubation in OR  Informed Consent: I have reviewed the patients History and Physical, chart, labs and discussed the procedure including the risks, benefits and alternatives for the proposed anesthesia with the patient or  authorized representative who has indicated his/her understanding and acceptance.     Dental Advisory Given  Plan Discussed with: CRNA and Surgeon  Anesthesia Plan Comments:         Anesthesia Quick Evaluation

## 2022-03-20 NOTE — Transfer of Care (Signed)
Immediate Anesthesia Transfer of Care Note  Patient: Ana [name]  Procedure(s) Performed: HYSTERECTOMY VAGINAL BILATERAL SALPINGECTOMY (Bilateral)  Patient Location: PACU  Anesthesia Type:General  Level of Consciousness: drowsy and patient cooperative  Airway & Oxygen Therapy: Patient Spontanous Breathing and Patient connected to face mask oxygen  Post-op Assessment: Report given to RN and Post -op Vital signs reviewed and stable  Post vital signs: Reviewed and stable  Last Vitals:  Vitals Value Taken Time  BP 101/70 03/20/22 1200  Temp 36.3 C 03/20/22 1157  Pulse 64 03/20/22 1201  Resp 12 03/20/22 1201  SpO2 100 % 03/20/22 1201  Vitals shown include unvalidated device data.  Last Pain:  Vitals:   03/20/22 1157  TempSrc:   PainSc: Asleep         Complications: No notable events documented.

## 2022-03-20 NOTE — Brief Op Note (Signed)
03/20/2022  11:50 AM  PATIENT:  Ana [name]  41 y.o. female  PRE-OPERATIVE DIAGNOSIS:  cervical intraepithilial neoplasia Grade 2  POST-OPERATIVE DIAGNOSIS:  cervical intraepithilial neoplasia Grade 2  PROCEDURE:  Procedure(s): HYSTERECTOMY VAGINAL (N/A) BILATERAL SALPINGECTOMY (Bilateral)  SURGEON:  Surgeon(s) and Role:    * Codie Hainer, Gwen Her, MD - Primary    * Will Bonnet, MD - Assisting  PHYSICIAN ASSISTANT: PA student Ulis Rias   ASSISTANTS: none   ANESTHESIA:   general  EBL:  30 mL   BLOOD ADMINISTERED:none  DRAINS: Urinary Catheter (Foley)   LOCAL MEDICATIONS USED:  LIDOCAINE   SPECIMEN:  Source of Specimen:  cervix , uterus and bilateral fallopian tube   DISPOSITION OF SPECIMEN:  PATHOLOGY  COUNTS:  YES  TOURNIQUET:  * No tourniquets in log *  DICTATION: .Other Dictation: Dictation Number verbal   PLAN OF CARE: Discharge to home after PACU  PATIENT DISPOSITION:  PACU - hemodynamically stable.   Delay start of Pharmacological VTE agent (>24hrs) due to surgical blood loss or risk of bleeding: n/a

## 2022-03-20 NOTE — Progress Notes (Signed)
Pt here for [employer] and bilateral salpingectomy  Labs reviewed . All questions answered . Proceed

## 2022-03-20 NOTE — Discharge Instructions (Addendum)
AMBULATORY SURGERY  DISCHARGE INSTRUCTIONS   The drugs that you were given will stay in your system until tomorrow so for the next 24 hours you should not:  Drive an automobile Make any legal decisions Drink any alcoholic beverage   You may resume regular meals tomorrow.  Today it is better to start with liquids and gradually work up to solid foods.  You may eat anything you prefer, but it is better to start with liquids, then soup and crackers, and gradually work up to solid foods.   Please notify your doctor immediately if you have any unusual bleeding, trouble breathing, redness and pain at the surgery site, drainage, fever, or pain not relieved by medication.    Additional Instructions:   Per pt, already prescribed med for nausea (zofran),  pain (oxycodone), and gabapentin and has at home.   Oxycodone '5mg'$  given at the hospital at 12:38 pm; can have again per prescription instructions if needed.        Please contact your physician with any problems or Same Day Surgery at [phone], Monday through Friday 6 am to 4 pm, or [place] at [facility] number at [phone].

## 2022-03-21 ENCOUNTER — Encounter: Payer: Self-pay | Admitting: Obstetrics and Gynecology

## 2022-03-21 LAB — SURGICAL PATHOLOGY

## 2022-03-21 NOTE — Anesthesia Postprocedure Evaluation (Signed)
Anesthesia Post Note  Patient: [employer] research officer, [employer]  Procedure(s) Performed: HYSTERECTOMY VAGINAL BILATERAL SALPINGECTOMY (Bilateral)  Patient location during evaluation: PACU Anesthesia Type: General Level of consciousness: awake and awake and alert Pain management: pain level controlled Vital Signs Assessment: post-procedure vital signs reviewed and stable Respiratory status: spontaneous breathing and patient connected to nasal cannula oxygen Cardiovascular status: stable Anesthetic complications: no   No notable events documented.   Last Vitals:  Vitals:   03/20/22 1300 03/20/22 1415  BP: 131/82 (P) 132/87  Pulse: 62 (!) (P) 57  Resp: 14 (P) 16  Temp: 36.4 C   SpO2: 100% (P) 100%    Last Pain:  Vitals:   03/20/22 1300  TempSrc: Temporal  PainSc: 4                  VAN STAVEREN,Ceili Boshers

## 2023-12-07 IMAGING — CR DG CHEST 2V
2 series · 2 of 2 positions shown · non-contrast
Comparison: 04/22/2012

CLINICAL DATA: Chest pain

EXAM:
CHEST - 2 VIEW

[chest pa]
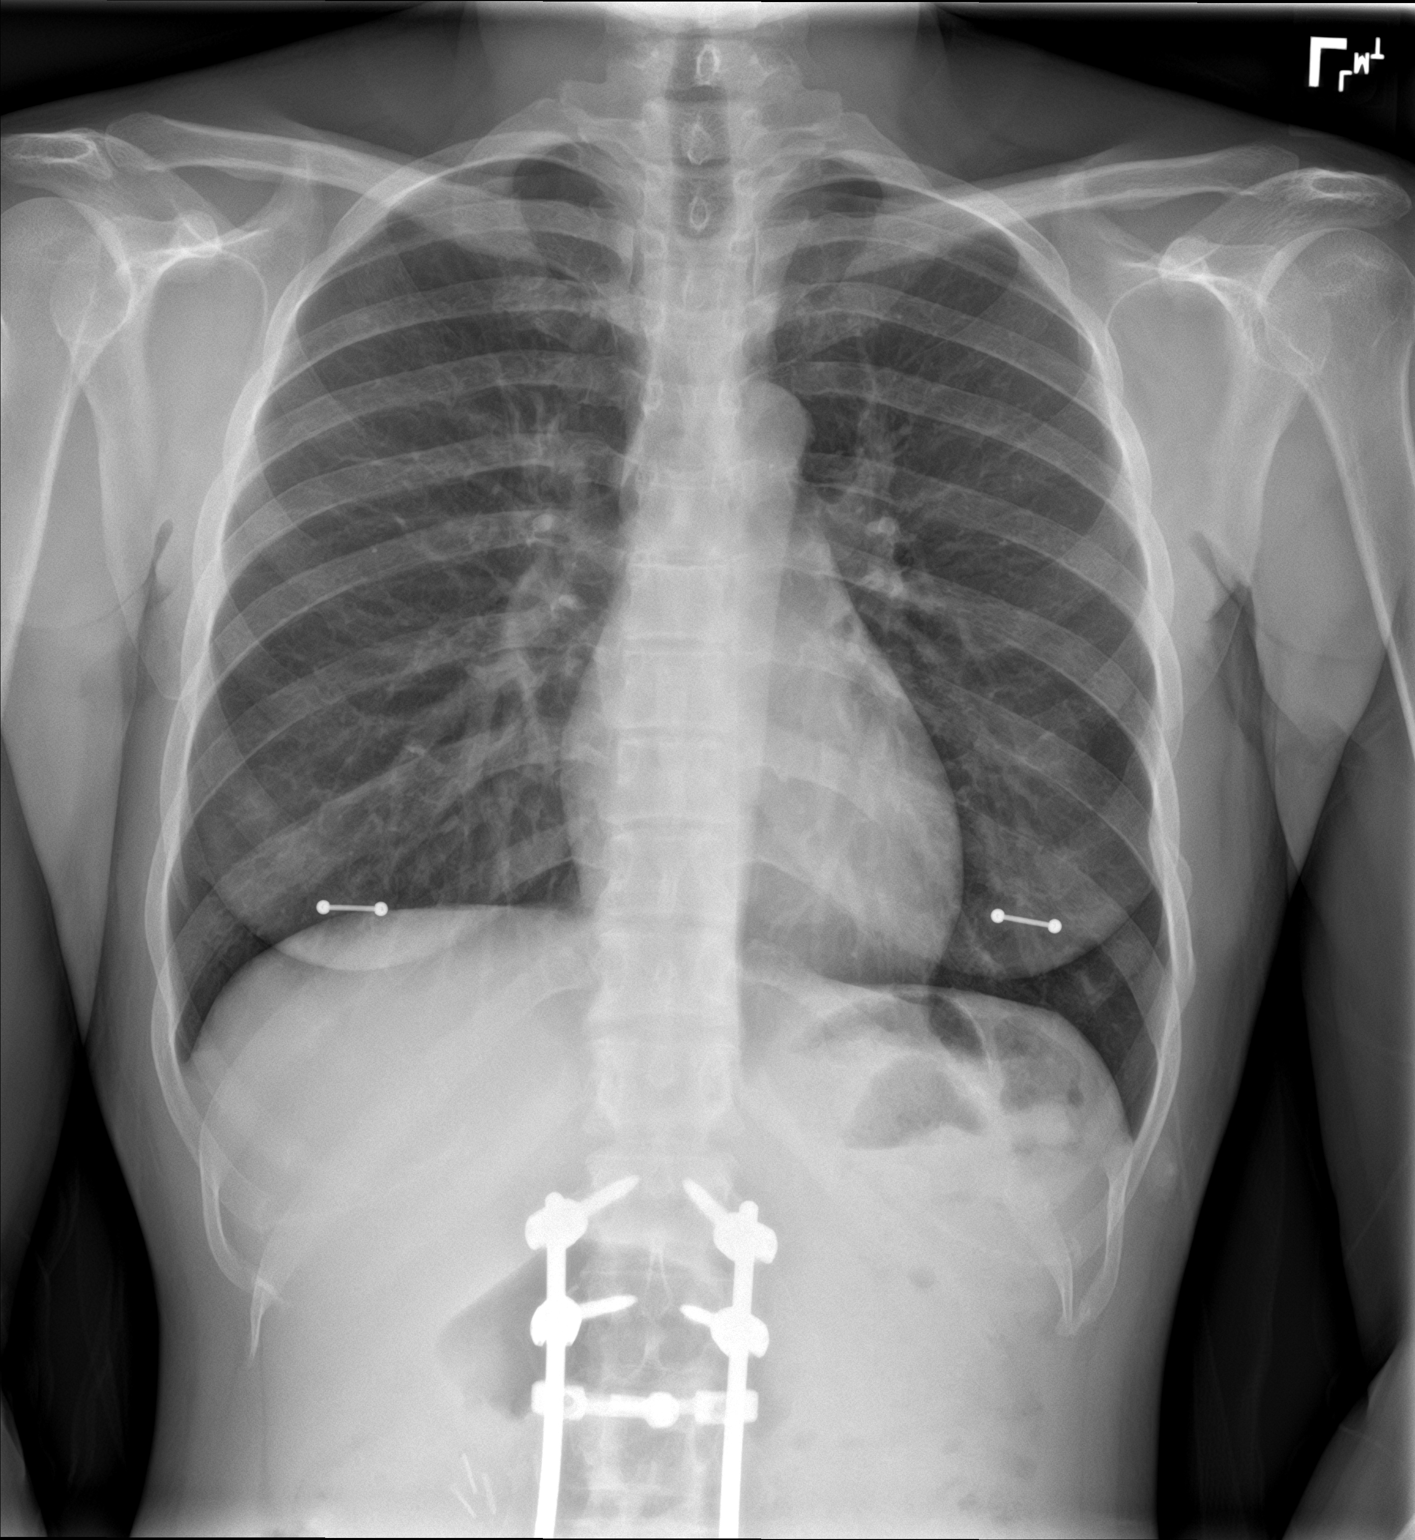

[chest lat]
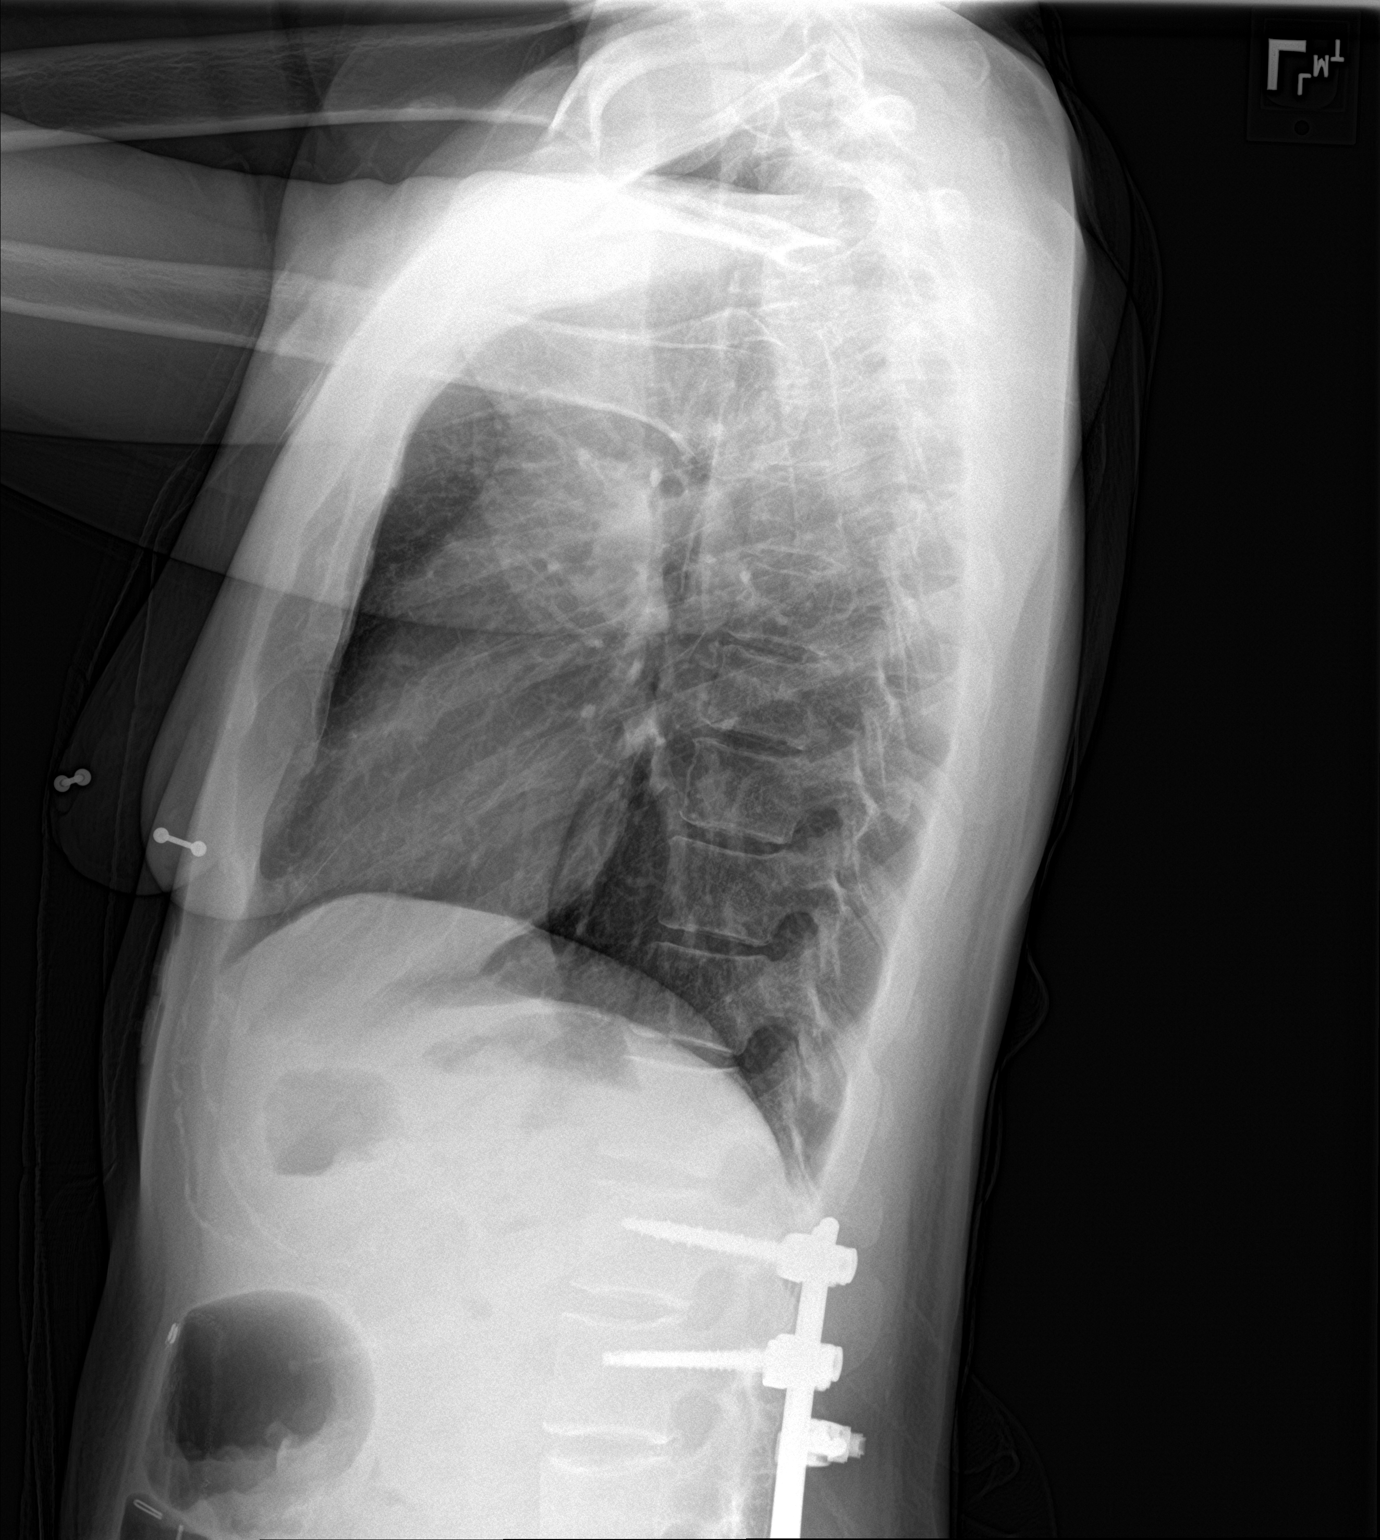

[2 of 2 positions shown; findings below may reference images not displayed]

FINDINGS: The heart size and mediastinal contours are within normal limits.
Both lungs are clear. No pleural effusion or pneumothorax. Partial
visualization of lumbar spine posterior rod and screw fusion
hardware. Cholecystectomy clips. Bilateral nipple piercings are
incidentally noted.
IMPRESSION: No active cardiopulmonary disease.
